# Patient Record
Sex: Female | Born: 1950 | Race: Black or African American | Hispanic: No | Marital: Single | Smoking: Former smoker
Health system: Southern US, Community
[De-identification: ages and names within clinical notes are randomized; demographics above are authoritative.]

## PROBLEM LIST (undated history)

## (undated) DIAGNOSIS — Z923 Personal history of irradiation: Secondary | ICD-10-CM

## (undated) DIAGNOSIS — C801 Malignant (primary) neoplasm, unspecified: Secondary | ICD-10-CM

## (undated) DIAGNOSIS — M5430 Sciatica, unspecified side: Secondary | ICD-10-CM

## (undated) DIAGNOSIS — I6529 Occlusion and stenosis of unspecified carotid artery: Secondary | ICD-10-CM

## (undated) DIAGNOSIS — E785 Hyperlipidemia, unspecified: Secondary | ICD-10-CM

## (undated) DIAGNOSIS — I10 Essential (primary) hypertension: Secondary | ICD-10-CM

## (undated) DIAGNOSIS — I2510 Atherosclerotic heart disease of native coronary artery without angina pectoris: Secondary | ICD-10-CM

## (undated) DIAGNOSIS — R519 Headache, unspecified: Secondary | ICD-10-CM

## (undated) HISTORY — DX: Atherosclerotic heart disease of native coronary artery without angina pectoris: I25.10

## (undated) HISTORY — DX: Sciatica, unspecified side: M54.30

## (undated) HISTORY — PX: CORONARY ARTERY BYPASS GRAFT: SHX141

## (undated) HISTORY — DX: Occlusion and stenosis of unspecified carotid artery: I65.29

## (undated) HISTORY — DX: Morbid (severe) obesity due to excess calories: E66.01

## (undated) HISTORY — DX: Hyperlipidemia, unspecified: E78.5

## (undated) HISTORY — DX: Essential (primary) hypertension: I10

---

## 2021-02-01 ENCOUNTER — Encounter: Payer: Self-pay | Admitting: Cardiology

## 2021-02-23 ENCOUNTER — Other Ambulatory Visit: Payer: Self-pay | Admitting: Family Medicine

## 2021-02-23 DIAGNOSIS — Z1231 Encounter for screening mammogram for malignant neoplasm of breast: Secondary | ICD-10-CM

## 2021-03-18 ENCOUNTER — Encounter: Payer: Self-pay | Admitting: *Deleted

## 2021-03-25 ENCOUNTER — Ambulatory Visit: Payer: Self-pay | Admitting: Cardiology

## 2021-03-28 ENCOUNTER — Other Ambulatory Visit: Payer: Self-pay

## 2021-03-28 ENCOUNTER — Ambulatory Visit: Payer: Medicare HMO | Admitting: Cardiology

## 2021-03-28 ENCOUNTER — Encounter: Payer: Self-pay | Admitting: Cardiology

## 2021-03-28 VITALS — BP 140/76 | HR 67 | Ht 65.0 in | Wt 211.0 lb

## 2021-03-28 DIAGNOSIS — R072 Precordial pain: Secondary | ICD-10-CM | POA: Diagnosis not present

## 2021-03-28 DIAGNOSIS — I10 Essential (primary) hypertension: Secondary | ICD-10-CM

## 2021-03-28 DIAGNOSIS — Z951 Presence of aortocoronary bypass graft: Secondary | ICD-10-CM

## 2021-03-28 DIAGNOSIS — E7800 Pure hypercholesterolemia, unspecified: Secondary | ICD-10-CM

## 2021-03-28 MED ORDER — ISOSORBIDE MONONITRATE ER 30 MG PO TB24: 30.0000 mg | ORAL_TABLET | Freq: Every day | ORAL | 3 refills | Status: DC

## 2021-03-28 NOTE — Patient Instructions (Signed)
Medication Instructions:   Your physician has recommended you make the following change in your medication:    START taking IMDUR (Isosorbide) 30 MG once a day.  *If you need a refill on your cardiac medications before your next appointment, please call your pharmacy*    Lab Work:  A BMP and a fasting Lipid will be drawn today.  If you have labs (blood work) drawn today and your tests are completely normal, you will receive your results only by: MyChart Message (if you have MyChart) OR A paper copy in the mail If you have any lab test that is abnormal or we need to change your treatment, we will call you to review the results.   Testing/Procedures:   Your physician has requested that you have an echocardiogram. Echocardiography is a painless test that uses sound waves to create images of your heart. It provides your doctor with information about the size and shape of your heart and how well your heart's chambers and valves are working. This procedure takes approximately one hour. There are no restrictions for this procedure.    2.  [facility] MYOVIEW   Your caregiver has ordered a Stress Test with nuclear imaging. The purpose of this test is to evaluate the blood supply to your heart muscle. This procedure is referred to as a "Non-Invasive Stress Test." This is because other than having an IV started in your vein, nothing is inserted or "invades" your body. Cardiac stress tests are done to find areas of poor blood flow to the heart by determining the extent of coronary artery disease (CAD). Some patients exercise on a treadmill, which naturally increases the blood flow to your heart, while others who are  unable to walk on a treadmill due to physical limitations have a pharmacologic/chemical stress agent called Lexiscan . This medicine will mimic walking on a treadmill by temporarily increasing your coronary blood flow.      PLEASE REPORT TO [facility] MEDICAL MALL ENTRANCE   THE VOLUNTEERS AT THE  FIRST DESK WILL DIRECT YOU WHERE TO GO     *Please note: these test may take anywhere between 2-4 hours to complete       Date of Procedure:_____________________________________   Arrival Time for Procedure:______________________________    PLEASE NOTIFY THE OFFICE AT LEAST 24 HOURS IN ADVANCE IF YOU ARE UNABLE TO KEEP YOUR APPOINTMENT.  [phone]  PLEASE NOTIFY NUCLEAR MEDICINE AT [facility] AT LEAST 24 HOURS IN ADVANCE IF YOU ARE UNABLE TO KEEP YOUR APPOINTMENT. [phone]       How to prepare for your Myoview test:    __XX__:  Hold betablocker(s) night before procedure and morning of procedure:   metoprolol tartrate (LOPRESSOR)    1. Do not eat or drink after midnight  2. No caffeine for 24 hours prior to test  3. No smoking 24 hours prior to test.  4. Unless instructed otherwise, Take your medication with a small sips of water.    5.         Ladies, please do not wear dresses. Skirts or pants are appropriate. Please wear a short sleeve shirt.  6. No perfume, cologne or lotion.  7. Wear comfortable walking shoes. No heels!     Follow-Up: At [facility], you and your health needs are our priority.  As part of our continuing mission to provide you with exceptional heart care, we have created designated Provider Care Teams.  These Care Teams include your primary Cardiologist (physician) and Advanced Practice Providers (  APPs -  Physician Assistants and Nurse Practitioners) who all work together to provide you with the care you need, when you need it.  We recommend signing up for the patient portal called "MyChart".  Sign up information is provided on this After Visit Summary.  MyChart is used to connect with patients for Virtual Visits (Telemedicine).  Patients are able to view lab/test results, encounter notes, upcoming appointments, etc.  Non-urgent messages can be sent to your provider as well.   To learn more about what you can do with MyChart, go to  [email or web address].    Your next appointment:   Follow up after testing   The format for your next appointment:   In Person  Provider:   Debbe Odea, MD  ONLY   Other Instructions

## 2021-03-28 NOTE — Progress Notes (Signed)
Cardiology Office Note:    Date:  03/28/2021   ID:  Tara Adams, DOB 04/16/1951, MRN [id number]  PCP:  [name], MD   [facility] Providers Cardiologist:  None     Referring MD: [name], MD   Chief Complaint  Patient presents with   New Patient (Initial Visit)    Referred by PCP for Intermittent chest pain. Patient states she has had CABG. Meds reviewed verbally with patient.     History of Present Illness:    Tara Adams is a 70 y.o. female with a hx of CAD/CABG x 2 (2002 ), hypertension, hyperlipidemia who presents due to chest pain.  Patient has a history of CAD/CABG about 20 years ago at [facility].  At that time, she had 4 vessels occluded, bypass was done to two of the vessels.  She was not given a reason why only 2 vessels were bypassed.  She has been doing well over the years, started having occasional chest pain 5 years ago associated with stress such as worrying about her family.  She typically takes nitroglycerin with relief of symptoms.  Also endorsed having shortness of breath with exertion.  Checks her blood pressure at home with systolic usually 427C.  Patient states she would not like to undergo another CABG procedure if possible.  Past Medical History:  Diagnosis Date   Coronary artery disease    Hyperlipidemia    Hypertension     Past Surgical History:  Procedure Laterality Date   CORONARY ARTERY BYPASS GRAFT N/A     Current Medications: Current Meds  Medication Sig   aspirin 325 MG tablet Take 325 mg by mouth daily.   atorvastatin (LIPITOR) 20 MG tablet Take 20 mg by mouth daily.   butalbital-acetaminophen-caffeine (FIORICET) 50-325-40 MG tablet Take 1-2 tablets by mouth every 4 (four) hours as needed for headache.   gabapentin (NEURONTIN) 100 MG capsule Take 100 mg by mouth 3 (three) times daily as needed.   hydrALAZINE (APRESOLINE) 50 MG tablet Take 50 mg by mouth 2 times daily at 12 noon and 4 pm.   ibuprofen (ADVIL)  800 MG tablet Take 800 mg by mouth every 8 (eight) hours as needed.   isosorbide mononitrate (IMDUR) 30 MG 24 hr tablet Take 1 tablet (30 mg total) by mouth daily.   meloxicam (MOBIC) 15 MG tablet Take 15 mg by mouth daily as needed.   metoprolol tartrate (LOPRESSOR) 100 MG tablet Take 100 mg by mouth 2 (two) times daily.   nitroGLYCERIN (NITROSTAT) 0.4 MG SL tablet Place 0.4 mg under the tongue every 5 (five) minutes as needed for chest pain.   tiZANidine (ZANAFLEX) 4 MG tablet Take 0.5-1 mg by mouth as needed for muscle spasms.   triamterene-hydrochlorothiazide (MAXZIDE) 75-50 MG tablet Take 1 tablet by mouth daily.     Allergies:   Codeine and Statins   Social History   Socioeconomic History   Marital status: Single    Spouse name: Not on file   Number of children: Not on file   Years of education: Not on file   Highest education level: Not on file  Occupational History   Not on file  Tobacco Use   Smoking status: Former    Pack years: 0.00    Types: Cigarettes    Quit date: 05/30/2019    Years since quitting: 1.8   Smokeless tobacco: Never  Substance and Sexual Activity   Alcohol use: Never    Comment: maybe 1-2  times a year   Drug use: Never   Sexual activity: Not on file  Other Topics Concern   Not on file  Social History Narrative   Not on file   Social Determinants of Health   Financial Resource Strain: Not on file  Food Insecurity: Not on file  Transportation Needs: Not on file  Physical Activity: Not on file  Stress: Not on file  Social Connections: Not on file     Family History: The patient's family history is not on file.  ROS:   Please see the history of present illness.     All other systems reviewed and are negative.  EKGs/Labs/Other Studies Reviewed:    The following studies were reviewed today:   EKG:  EKG is  ordered today.  The ekg ordered today demonstrates normal sinus rhythm, nonspecific T wave changes  Recent Labs: No results found  for requested labs within last 8760 hours.  Recent Lipid Panel No results found for: CHOL, TRIG, HDL, CHOLHDL, VLDL, LDLCALC, LDLDIRECT   Risk Assessment/Calculations:          Physical Exam:    VS:  BP 140/76 (BP Location: Left Arm, Patient Position: Sitting, Cuff Size: Normal)   Pulse 67   Ht 5\' 5"  (1.651 m)   Wt 211 lb (95.7 kg)   SpO2 93%   BMI 35.11 kg/m     Wt Readings from Last 3 Encounters:  03/28/21 211 lb (95.7 kg)     GEN:  Well nourished, well developed in no acute distress HEENT: Normal NECK: No JVD; No carotid bruits LYMPHATICS: No lymphadenopathy CARDIAC: RRR, 1/6 systolic murmurs, rubs, gallops RESPIRATORY:  Clear to auscultation without rales, wheezing or rhonchi  ABDOMEN: Soft, non-tender, non-distended MUSCULOSKELETAL:  No edema; No deformity  SKIN: Warm and dry NEUROLOGIC:  Alert and oriented x 3 PSYCHIATRIC:  Normal affect   ASSESSMENT:    1. Precordial pain   2. Hx of CABG   3. Primary hypertension   4. Pure hypercholesterolemia    PLAN:    In order of problems listed above:  Chest pain, risk factors CAD, hypertension, hyperlipidemia.  Get echo to evaluate systolic and diastolic function, get Lexiscan Myoview to evaluate presence of ischemia.  Start Imdur 30 mg daily for antianginal benefit.  Continue aspirin, Lipitor.  Consider decreasing dose of aspirin at follow-up visit to 81 mg. History of CAD/CABG x2.  Chest pain consistent with angina.  Echo and Lexiscan as above.  Aspirin, Lipitor, Imdur, beta-blocker.  Would not like to undergo another CABG procedure if possible. Hypertension, BP elevated.  Continue Lopressor, HCTZ, start Imdur. Hyperlipidemia, obtain fasting lipid profile.  Continue Lipitor.  Titrate statin as needed based on lipid panel results.  Follow-up after echo and Myoview.   Shared Decision Making/Informed Consent The risks [chest pain, shortness of breath, cardiac arrhythmias, dizziness, blood pressure fluctuations,  myocardial infarction, stroke/transient ischemic attack, nausea, vomiting, allergic reaction, radiation exposure, metallic taste sensation and life-threatening complications (estimated to be 1 in 10,000)], benefits (risk stratification, diagnosing coronary artery disease, treatment guidance) and alternatives of a nuclear stress test were discussed in detail with Tara Adams and she agrees to proceed.    Medication Adjustments/Labs and Tests Ordered: Current medicines are reviewed at length with the patient today.  Concerns regarding medicines are outlined above.  Orders Placed This Encounter  Procedures   NM Myocar Multi W/Spect W/Wall Motion / EF   Lipid panel   Basic metabolic panel   EKG 12-Lead  ECHOCARDIOGRAM COMPLETE    Meds ordered this encounter  Medications   isosorbide mononitrate (IMDUR) 30 MG 24 hr tablet    Sig: Take 1 tablet (30 mg total) by mouth daily.    Dispense:  30 tablet    Refill:  3     Patient Instructions  Medication Instructions:   Your physician has recommended you make the following change in your medication:    START taking IMDUR (Isosorbide) 30 MG once a day.  *If you need a refill on your cardiac medications before your next appointment, please call your pharmacy*    Lab Work:  A BMP and a fasting Lipid will be drawn today.  If you have labs (blood work) drawn today and your tests are completely normal, you will receive your results only by: MyChart Message (if you have MyChart) OR A paper copy in the mail If you have any lab test that is abnormal or we need to change your treatment, we will call you to review the results.   Testing/Procedures:   Your physician has requested that you have an echocardiogram. Echocardiography is a painless test that uses sound waves to create images of your heart. It provides your doctor with information about the size and shape of your heart and how well your heart's chambers and valves are working. This  procedure takes approximately one hour. There are no restrictions for this procedure.    2.  [facility] MYOVIEW   Your caregiver has ordered a Stress Test with nuclear imaging. The purpose of this test is to evaluate the blood supply to your heart muscle. This procedure is referred to as a "Non-Invasive Stress Test." This is because other than having an IV started in your vein, nothing is inserted or "invades" your body. Cardiac stress tests are done to find areas of poor blood flow to the heart by determining the extent of coronary artery disease (CAD). Some patients exercise on a treadmill, which naturally increases the blood flow to your heart, while others who are  unable to walk on a treadmill due to physical limitations have a pharmacologic/chemical stress agent called Lexiscan . This medicine will mimic walking on a treadmill by temporarily increasing your coronary blood flow.      PLEASE REPORT TO [facility] MEDICAL MALL ENTRANCE   THE VOLUNTEERS AT THE FIRST DESK WILL DIRECT YOU WHERE TO GO     *Please note: these test may take anywhere between 2-4 hours to complete       Date of Procedure:_____________________________________   Arrival Time for Procedure:______________________________    PLEASE NOTIFY THE OFFICE AT LEAST 24 HOURS IN ADVANCE IF YOU ARE UNABLE TO KEEP YOUR APPOINTMENT.  [phone]  PLEASE NOTIFY NUCLEAR MEDICINE AT [facility] AT LEAST 24 HOURS IN ADVANCE IF YOU ARE UNABLE TO KEEP YOUR APPOINTMENT. [phone]       How to prepare for your Myoview test:    __XX__:  Hold betablocker(s) night before procedure and morning of procedure:   metoprolol tartrate (LOPRESSOR)    1. Do not eat or drink after midnight  2. No caffeine for 24 hours prior to test  3. No smoking 24 hours prior to test.  4. Unless instructed otherwise, Take your medication with a small sips of water.    5.         Ladies, please do not wear dresses. Skirts or pants are appropriate. Please wear a short  sleeve shirt.  6. No perfume, cologne or lotion.  7. Wear  comfortable walking shoes. No heels!     Follow-Up: At [facility], you and your health needs are our priority.  As part of our continuing mission to provide you with exceptional heart care, we have created designated Provider Care Teams.  These Care Teams include your primary Cardiologist (physician) and Advanced Practice Providers (APPs -  Physician Assistants and Nurse Practitioners) who all work together to provide you with the care you need, when you need it.  We recommend signing up for the patient portal called "MyChart".  Sign up information is provided on this After Visit Summary.  MyChart is used to connect with patients for Virtual Visits (Telemedicine).  Patients are able to view lab/test results, encounter notes, upcoming appointments, etc.  Non-urgent messages can be sent to your provider as well.   To learn more about what you can do with MyChart, go to [email or web address].    Your next appointment:   Follow up after testing   The format for your next appointment:   In Person  Provider:   Debbe OdeaBrian Agbor-Etang, MD  ONLY   Other Instructions     Signed, Debbe OdeaBrian Agbor-Etang, MD  03/28/2021 5:09 PM    [facility]

## 2021-03-29 LAB — BASIC METABOLIC PANEL
BUN/Creatinine Ratio: 19 (ref 12–28)
BUN: 20 mg/dL (ref 8–27)
CO2: 22 mmol/L (ref 20–29)
Calcium: 9.5 mg/dL (ref 8.7–10.3)
Chloride: 102 mmol/L (ref 96–106)
Creatinine, Ser: 1.06 mg/dL — ABNORMAL HIGH (ref 0.57–1.00)
Glucose: 70 mg/dL (ref 65–99)
Potassium: 3.8 mmol/L (ref 3.5–5.2)
Sodium: 143 mmol/L (ref 134–144)
eGFR: 56 mL/min/{1.73_m2} — ABNORMAL LOW (ref >59)

## 2021-03-29 LAB — LIPID PANEL
Chol/HDL Ratio: 3.8 ratio (ref 0.0–4.4)
Cholesterol, Total: 219 mg/dL — ABNORMAL HIGH (ref 100–199)
HDL: 58 mg/dL (ref >39)
LDL Chol Calc (NIH): 130 mg/dL — ABNORMAL HIGH (ref 0–99)
Triglycerides: 177 mg/dL — ABNORMAL HIGH (ref 0–149)
VLDL Cholesterol Cal: 31 mg/dL (ref 5–40)

## 2021-03-30 ENCOUNTER — Telehealth: Payer: Self-pay | Admitting: Cardiology

## 2021-03-30 NOTE — Telephone Encounter (Signed)
-----   Message from Loman Chroman sent at 03/29/2021  8:01 AM EDT ----- Regarding: schedule f/u Patient needs to schedule f/u post testing. LVM

## 2021-03-30 NOTE — Telephone Encounter (Signed)
Attempted to schedule.  LMOV to call office.  ° °

## 2021-03-31 ENCOUNTER — Telehealth: Payer: Self-pay

## 2021-03-31 MED ORDER — ATORVASTATIN CALCIUM 80 MG PO TABS: 80.0000 mg | ORAL_TABLET | Freq: Every day | ORAL | 3 refills | Status: DC

## 2021-03-31 NOTE — Telephone Encounter (Signed)
-----   Message from Debbe Odea, MD sent at 03/31/2021  9:54 AM EDT ----- Cholesterol not controlled, increase Lipitor to 80 mg daily.  Okay to decrease aspirin dose to 81 mg daily.  Thank you

## 2021-03-31 NOTE — Telephone Encounter (Signed)
The patient has been notified of the result and verbalized understanding.  All questions (if any) were answered. Gibson Ramp, RN 03/31/2021 4:46 PM

## 2021-03-31 NOTE — Telephone Encounter (Signed)
Called patient to give her lab result note. Left a VM for a call back.

## 2021-04-08 ENCOUNTER — Other Ambulatory Visit: Payer: Medicare HMO

## 2021-04-11 ENCOUNTER — Encounter
Admission: RE | Admit: 2021-04-11 | Discharge: 2021-04-11 | Disposition: A | Discharge status: Home / Self Care | Payer: Medicare HMO | Source: Ambulatory Visit | Attending: Cardiology | Admitting: Cardiology

## 2021-04-11 ENCOUNTER — Other Ambulatory Visit: Payer: Self-pay

## 2021-04-11 DIAGNOSIS — R072 Precordial pain: Secondary | ICD-10-CM | POA: Insufficient documentation

## 2021-04-11 MED ORDER — TECHNETIUM TC 99M TETROFOSMIN IV KIT
10.0000 | PACK | Freq: Once | INTRAVENOUS | Status: AC | PRN
  Administered 2021-04-11: 10.03 via INTRAVENOUS

## 2021-04-11 MED ORDER — TECHNETIUM TC 99M TETROFOSMIN IV KIT
30.6200 | PACK | Freq: Once | INTRAVENOUS | Status: AC | PRN
  Administered 2021-04-11: 30.62 via INTRAVENOUS

## 2021-04-11 MED ORDER — REGADENOSON 0.4 MG/5ML IV SOLN
0.4000 mg | Freq: Once | INTRAVENOUS | Status: AC
  Administered 2021-04-11: 0.4 mg via INTRAVENOUS

## 2021-04-12 ENCOUNTER — Telehealth: Payer: Self-pay | Admitting: *Deleted

## 2021-04-12 LAB — NM MYOCAR MULTI W/SPECT W/WALL MOTION / EF
LV dias vol: 145 mL (ref 46–106)
LV sys vol: 57 mL
Peak HR: 95 {beats}/min
Percent HR: 64 %
Rest HR: 60 {beats}/min
TID: 1.24

## 2021-04-12 NOTE — Telephone Encounter (Signed)
Called and spoke with patient in regards to her stress test results. Advised we would like to see her in the office to review those results and determine need for further testing. She was agreeable to come in on Friday to see APP. Confirmed with provider to add on hospital day. Scheduling notified to place on that day and the time. Patient aware of appointment with no further questions at this time.

## 2021-04-12 NOTE — Telephone Encounter (Signed)
-----   Message from Yvonne Kendall, MD sent at 04/12/2021  6:40 AM EDT ----- Regarding: Abnormal stress test Good morning,  I wanted to alert you that Ms. Collazos's stress test yesterday is abnormal, high risk.  I know Dr. Azucena Cecil is out of the office this week, so I wanted to make sure that the result is conveyed to the patient.  Would it be possible to have her see an APP this week to reassess her symptoms and discuss proceeding with cath?  Thanks.  Thayer Ohm

## 2021-04-15 ENCOUNTER — Encounter: Payer: Self-pay | Admitting: Nurse Practitioner

## 2021-04-15 ENCOUNTER — Other Ambulatory Visit: Payer: Self-pay

## 2021-04-15 ENCOUNTER — Ambulatory Visit: Payer: Medicare HMO | Admitting: Nurse Practitioner

## 2021-04-15 VITALS — BP 140/74 | HR 75 | Ht 65.0 in | Wt 214.0 lb

## 2021-04-15 DIAGNOSIS — I639 Cerebral infarction, unspecified: Secondary | ICD-10-CM

## 2021-04-15 DIAGNOSIS — I200 Unstable angina: Secondary | ICD-10-CM | POA: Diagnosis not present

## 2021-04-15 DIAGNOSIS — R072 Precordial pain: Secondary | ICD-10-CM

## 2021-04-15 DIAGNOSIS — G4730 Sleep apnea, unspecified: Secondary | ICD-10-CM

## 2021-04-15 DIAGNOSIS — E785 Hyperlipidemia, unspecified: Secondary | ICD-10-CM

## 2021-04-15 DIAGNOSIS — I519 Heart disease, unspecified: Secondary | ICD-10-CM

## 2021-04-15 DIAGNOSIS — I25110 Atherosclerotic heart disease of native coronary artery with unstable angina pectoris: Secondary | ICD-10-CM | POA: Diagnosis not present

## 2021-04-15 DIAGNOSIS — I255 Ischemic cardiomyopathy: Secondary | ICD-10-CM

## 2021-04-15 DIAGNOSIS — I10 Essential (primary) hypertension: Secondary | ICD-10-CM

## 2021-04-15 DIAGNOSIS — R531 Weakness: Secondary | ICD-10-CM

## 2021-04-15 MED ORDER — HYDRALAZINE HCL 50 MG PO TABS: 50.0000 mg | ORAL_TABLET | Freq: Three times a day (TID) | ORAL | 0 refills | Status: DC

## 2021-04-15 NOTE — Patient Instructions (Addendum)
Medication Instructions:  Your physician has recommended you make the following change in your medication:   INCREASE Hydralazine 50 mg three times a day  *If you need a refill on your cardiac medications before your next appointment, please call your pharmacy*   Lab Work: CBC & BMET to be done today  If you have labs (blood work) drawn today and your tests are completely normal, you will receive your results only by: MyChart Message (if you have MyChart) OR A paper copy in the mail If you have any lab test that is abnormal or we need to change your treatment, we will call you to review the results.   Testing/Procedures:  Your physician has requested that you have a carotid duplex.   This test is an ultrasound of the carotid arteries in your neck. It looks at blood flow through these arteries that supply the brain with blood. Allow one hour for this exam. There are no restrictions or special instructions.  Your physician has requested that you have an echocardiogram.   Echocardiography is a painless test that uses sound waves to create images of your heart. It provides your doctor with information about the size and shape of your heart and how well your heart's chambers and valves are working. This procedure takes approximately one hour. There are no restrictions for this procedure.  [facility] Cardiac Cath Instructions  You are scheduled for a Cardiac Cath on: Friday 04/22/21 Please arrive at 09:00 am on the day of your procedure Please expect a call from our [facility] to pre-register you Do not eat/drink anything after midnight Someone will need to drive you home It is recommended someone be with you for the first 24 hours after your procedure Wear clothes that are easy to get on/off and wear slip on shoes if possible   Medications bring a current list of all medications with you  _XX__ You may take all of your medications the morning of your procedure with  enough water to swallow safely    Day of your procedure: Arrive at the Medical Mall entrance.  Free valet service is available.  After entering the Medical Mall please check-in at the registration desk (1st desk on your right) to receive your armband. After receiving your armband someone will escort you to the cardiac cath/special procedures waiting area.  The usual length of stay after your procedure is about 2 to 3 hours.  This can vary.  If you have any questions, please call our office at [phone], or you may call the cardiac cath lab at [facility] directly at [phone]    Follow-Up: At [facility], you and your health needs are our priority.  As part of our continuing mission to provide you with exceptional heart care, we have created designated Provider Care Teams.  These Care Teams include your primary Cardiologist (physician) and Advanced Practice Providers (APPs -  Physician Assistants and Nurse Practitioners) who all work together to provide you with the care you need, when you need it.  We recommend signing up for the patient portal called "MyChart".  Sign up information is provided on this After Visit Summary.  MyChart is used to connect with patients for Virtual Visits (Telemedicine).  Patients are able to view lab/test results, encounter notes, upcoming appointments, etc.  Non-urgent messages can be sent to your provider as well.   To learn more about what you can do with MyChart, go to [email or web address].    Your next appointment:  Friday May 06, 2021 at 2:30 pm with Nicolasa Ducking NP (Per CB)  The format for your next appointment:   In Person  Provider:   Nicolasa Ducking, NP

## 2021-04-15 NOTE — Progress Notes (Signed)
Cardiology Clinic Note   Patient Name: Tara Adams Date of Encounter: 04/15/2021  Primary Care Provider:  [name], MD Primary Cardiologist:  Debbe Odea, MD  Patient Profile    70 year old female with a history of CAD status post two-vessel bypass, hypertension, and hyperlipidemia, presents for follow-up secondary to chest pain and abnormal stress test.  Past Medical History    Past Medical History:  Diagnosis Date   Coronary artery disease    a. ~ 2002 s/p CABG x 2 ([facility]); b. 03/2021 MV: Inferior, inferolateral, mid and apical anterior ischemia. EF 41%. High risk study.   Hyperlipidemia    Hypertension    Morbid obesity (HCC)    Past Surgical History:  Procedure Laterality Date   CORONARY ARTERY BYPASS GRAFT N/A     Allergies  Allergies  Allergen Reactions   Codeine     History of Present Illness    70 year old female with the above past medical history including coronary artery disease, hypertension, and hyperlipidemia.  She underwent two-vessel bypass approximately 20 years ago at [facility]. She was lost to cardiology f/u at some point after surgery.  She had been doing well and remaining relatively active but over the past year, she has noted progressive exertional dyspnea along with progressive exertional chest pain, now occurring several times/wk, generally at higher levels of activity, lasting about 5 minutes, resolving with rest.  She will sometimes take a several nitroglycerin tablet if symptoms last longer than a few minutes and or moved to her left shoulder.  Because of progressive symptoms, her primary care provider referred her to see Dr. Azucena Cecil on July 11.  A Lexiscan Myoview was ordered and recently performed showing severe inferior and inferolateral ischemia as well as moderate mid and apical anterior ischemia.  EF was 41%.  Given the high risk nature of the study, the patient was contacted with recommendation for  follow-up diagnostic catheterization.  She notes that since her last visit, she has continued to have exertional dyspnea and chest pain.  She did try the isosorbide that was previously prescribed but she did not tolerate because it "made her feel funny."  She denies palpitations, PND, orthopnea, dizziness, syncope, edema, or early satiety.  She notes that several months ago, she had an episode where she suddenly felt lightheaded and this was associated with right-sided weakness which lasted a few minutes and resolve spontaneously.  She has never had recurrence of this weakness.    Home Medications    Current Outpatient Medications  Medication Sig Dispense Refill   aspirin 325 MG tablet Take 325 mg by mouth daily.     atorvastatin (LIPITOR) 80 MG tablet Take 1 tablet (80 mg total) by mouth daily. 90 tablet 3   butalbital-acetaminophen-caffeine (FIORICET) 50-325-40 MG tablet Take 1-2 tablets by mouth every 4 (four) hours as needed for headache.     gabapentin (NEURONTIN) 100 MG capsule Take 100 mg by mouth 3 (three) times daily as needed.     gentamicin (GARAMYCIN) 0.3 % ophthalmic solution 1 drop 4 (four) times daily.     hydrALAZINE (APRESOLINE) 50 MG tablet Take 50 mg by mouth 2 times daily at 12 noon and 4 pm.     ibuprofen (ADVIL) 800 MG tablet Take 800 mg by mouth every 8 (eight) hours as needed.     meloxicam (MOBIC) 15 MG tablet Take 15 mg by mouth daily as needed.     metoprolol tartrate (LOPRESSOR) 100 MG tablet Take  100 mg by mouth 2 (two) times daily.     nitroGLYCERIN (NITROSTAT) 0.4 MG SL tablet Place 0.4 mg under the tongue every 5 (five) minutes as needed for chest pain.     tiZANidine (ZANAFLEX) 4 MG tablet Take 0.5-1 mg by mouth as needed for muscle spasms.     triamterene-hydrochlorothiazide (MAXZIDE) 75-50 MG tablet Take 1 tablet by mouth daily.     isosorbide mononitrate (IMDUR) 30 MG 24 hr tablet Take 1 tablet (30 mg total) by mouth daily. (Patient not taking: Reported on  04/15/2021) 30 tablet 3   No current facility-administered medications for this visit.     Family History    History reviewed. No pertinent family history. She indicated that her mother is alive. She indicated that her father is deceased.  Social History    Social History   Socioeconomic History   Marital status: Single    Spouse name: Not on file   Number of children: Not on file   Years of education: Not on file   Highest education level: Not on file  Occupational History   Not on file  Tobacco Use   Smoking status: Former    Types: Cigarettes    Quit date: 05/30/2019    Years since quitting: 1.8   Smokeless tobacco: Never  Substance and Sexual Activity   Alcohol use: Never   Drug use: Never   Sexual activity: Not on file  Other Topics Concern   Not on file  Social History Narrative   Lives in [place].  Volunteers, driving elderly individuals.  Does not routinely exercise.   Social Determinants of Health   Financial Resource Strain: Not on file  Food Insecurity: Not on file  Transportation Needs: Not on file  Physical Activity: Not on file  Stress: Not on file  Social Connections: Not on file  Intimate Partner Violence: Not on file     Review of Systems    General:  No chills, fever, night sweats or weight changes.  Cardiovascular:  +++ Exertional chest pain, +++ dyspnea on exertion, no edema, orthopnea, palpitations, paroxysmal nocturnal dyspnea. Dermatological: No rash, lesions/masses Respiratory: No cough, +++ dyspnea Urologic: No hematuria, dysuria Abdominal:   No nausea, vomiting, diarrhea, bright red blood per rectum, melena, or hematemesis Neurologic:  No visual changes, +++ episode of right-sided weakness and lightheadedness several months ago.  No changes in mental status. All other systems reviewed and are otherwise negative except as noted above.  Physical Exam    VS:  BP 140/74 (BP Location: Left Arm, Patient Position: Sitting, Cuff Size:  Large)   Pulse 75   Ht 5\' 5"  (1.651 m)   Wt 214 lb (97.1 kg)   SpO2 98%   BMI 35.61 kg/m  , BMI Body mass index is 35.61 kg/m. STOP-Bang Score:  7      GEN: Obese, in no acute distress. HEENT: normal. Neck: Supple, no JVD, carotid bruits, or masses. Cardiac: RRR, 1/6 syst murmur @ the RUSB.  No rubs, or gallops. No clubbing, cyanosis, edema.  Radials/PT 2+ and equal bilaterally.  Respiratory:  Respirations regular and unlabored, clear to auscultation bilaterally. GI: Obese, soft, nontender, nondistended, BS + x 4. MS: no deformity or atrophy. Skin: warm and dry, no rash. Neuro:  Strength and sensation are intact. Psych: Normal affect.  Accessory Clinical Findings    ECG performed July 11-personally reviewed by me today-regular sinus rhythm, 67, incomplete right bundle branch block, inferolateral ST and T abnormalities  Lab Results  Component Value Date   CREATININE 1.06 (H) 03/28/2021   BUN 20 03/28/2021   NA 143 03/28/2021   K 3.8 03/28/2021   CL 102 03/28/2021   CO2 22 03/28/2021   Lab Results  Component Value Date   CHOL 219 (H) 03/28/2021   HDL 58 03/28/2021   LDLCALC 130 (H) 03/28/2021   TRIG 177 (H) 03/28/2021   CHOLHDL 3.8 03/28/2021    Assessment & Plan   1.  Unstable angina/coronary artery disease: Patient with a history of CABG ~ 20 yrs ago in [place].  She believes that she had a 2 vessel bypass, which included a LIMA.  Over the past year, she has had progressive DOE and exertional chest pain, which has been occurring a few times/wk, lasting ~ 5 mins, and resolving with rest or sl NTG.  In the setting of progressive symptoms, she recently underwent stress testing, revealing severe inferior inferior and inferolateral ischemia as well as moderate mid and apical anterior ischemia.  EF was 41%. She was tried on imdur, but this caused her to feel funny, and so she stopped taking.  We discussed options for mgmt of ongoing Ss and abnl stress test, and will plan on a  diagnostic catheterization next week.  The patient understands that risks include but are not limited to stroke (1 in 1000), death (1 in 1000), kidney failure [usually temporary] (1 in 500), bleeding (1 in 200), allergic reaction [possibly serious] (1 in 200), and agrees to proceed. Cont asa, statin, and ? blocker therapy.    2.  Essential HTN:  poorly controlled.  She says that she is accustomed to seeing BPs in the 140's.  I have asked her to begin taking her hydralazine 3 times a day.  She otherwise remains on diuretic and beta-blocker therapy.  3.  Hyperlipidemia: LDL was 130 on July 11 and her Lipitor dose was increased to 80 mg.  She is tolerating this thus far.  She will require follow-up lipids and LFTs in about 6 weeks.  4.  Right-sided weakness: Patient reports today that several months ago possibly longer, she had an episode of headache with lightheadedness and right-sided weakness that lasted few minutes and resolve spontaneously.  She has had no recurrence.  Her neuro exam is normal.  I do not appreciate any carotid bruits we will arrange for a carotid ultrasound to rule out.  We will also arrange for 2D echocardiogram, especially in light of mild LV dysfunction noted on stress testing.  5.  Ischemic cardiomyopathy: EF measured at 41% on stress testing though visually, it was felt to be better.  We will arrange for echocardiogram.  Continue beta-blocker.  His LV function is truly depressed, would have a low threshold to add ARB or Entresto post cath.  6.  Sleep disordered breathing: Patient notes that she sleeps "funky."  She believes she snores and is not always well rested in the morning.  STOP-Bang = 7.  We discussed outpatient sleep study and she would be interested but we agreed to defer until at least after catheterization.  7.  Morbid obesity: Pending catheterization results, patient would likely benefit from cardiac rehabilitation.  8.  Disposition: Follow-up CBC and basic  metabolic panel today.  Arrange for carotid ultrasound and 2D echocardiogram.  Plan on diagnostic catheterization next week.  Follow-up in clinic 2 weeks post catheterization.+  Nicolasa Ducking, NP 04/15/2021, 3:25 PM

## 2021-04-16 LAB — BASIC METABOLIC PANEL
BUN/Creatinine Ratio: 22 (ref 12–28)
BUN: 26 mg/dL (ref 8–27)
CO2: 19 mmol/L — ABNORMAL LOW (ref 20–29)
Calcium: 9.4 mg/dL (ref 8.7–10.3)
Chloride: 105 mmol/L (ref 96–106)
Creatinine, Ser: 1.2 mg/dL — ABNORMAL HIGH (ref 0.57–1.00)
Glucose: 76 mg/dL (ref 65–99)
Potassium: 4 mmol/L (ref 3.5–5.2)
Sodium: 143 mmol/L (ref 134–144)
eGFR: 48 mL/min/{1.73_m2} — ABNORMAL LOW (ref >59)

## 2021-04-16 LAB — CBC
Hematocrit: 39.2 % (ref 34.0–46.6)
Hemoglobin: 12.4 g/dL (ref 11.1–15.9)
MCH: 23.9 pg — ABNORMAL LOW (ref 26.6–33.0)
MCHC: 31.6 g/dL (ref 31.5–35.7)
MCV: 76 fL — ABNORMAL LOW (ref 79–97)
Platelets: 247 10*3/uL (ref 150–450)
RBC: 5.18 x10E6/uL (ref 3.77–5.28)
RDW: 16.6 % — ABNORMAL HIGH (ref 11.7–15.4)
WBC: 5.4 10*3/uL (ref 3.4–10.8)

## 2021-04-18 ENCOUNTER — Telehealth: Payer: Self-pay | Admitting: *Deleted

## 2021-04-18 NOTE — Telephone Encounter (Signed)
Left voicemail message to call back for review of results.  

## 2021-04-18 NOTE — Telephone Encounter (Signed)
Reviewed results and recommendations with patient. She verbalized understanding and wanted to check with [place] about knot under her arm. Recommended that she check with her primary care provider for that and her lab results. Instructed her to hold triamterene-Hydrochlorothiazide for Wednesday, Thursday, and Friday. Also discussed importance to hydrate as well. She verbalized understanding of our conversation, read back instructions, and had no further questions at this time.

## 2021-04-18 NOTE — Telephone Encounter (Signed)
-----   Message from Creig Hines, NP sent at 04/16/2021 11:32 AM EDT ----- H/H nl.  She is mildly microcytic/hypchromic - often seen in iron deficiency - but not anemic.  This can be f/u by PCP.  Creat mildly elevated @ 1.2.  Overall, labs suitable for cath on 8/5, but she should increase hydration this week and hold her triamterene/hctz on Wed, Thurs, and Fri.

## 2021-04-19 ENCOUNTER — Telehealth: Payer: Self-pay | Admitting: Cardiology

## 2021-04-19 NOTE — Telephone Encounter (Signed)
Attempted to call pt back. No answer. Lmtcb.  

## 2021-04-19 NOTE — Telephone Encounter (Signed)
Patient returning call.

## 2021-04-19 NOTE — Telephone Encounter (Signed)
Spoke to pt. Notified if she is negative and has no covid symptoms then she may proceed with cath procedure this Friday.  Advised pt to let us know if she does develop s/s and/or re tests positive at home.  Pt voiced understanding.

## 2021-04-19 NOTE — Telephone Encounter (Signed)
Patient has cardiac cath on Friday 08/05 Patient lives with niece and daughter, they have both tested positive for COVID  Patient took a COVID test today and it came back negative Would like to know if she can still continue with cath Please call to discuss

## 2021-04-21 ENCOUNTER — Telehealth: Payer: Self-pay | Admitting: Cardiology

## 2021-04-21 NOTE — Telephone Encounter (Signed)
Patient seen in office on 04/15/21 by Ward Givens, NP and he ordered the cath at that time.   To RN for Ward Givens, NP.

## 2021-04-21 NOTE — Telephone Encounter (Signed)
Patient would like to r/s her cath for tomorrow due to COVID like symptoms. Please call.

## 2021-04-21 NOTE — Telephone Encounter (Signed)
Noted. Closing ecounter

## 2021-04-21 NOTE — Telephone Encounter (Signed)
Spoke with patient and she tested positive for COVID. She requested to cancel procedure and will call back to reschedule once she is feeling better. Several people in her household have tested positive so she just wishes to wait for now and verbalized understanding to please call us back once she is ready to reschedule. Advised I would update providers as well. No further questions for now.

## 2021-04-21 NOTE — Telephone Encounter (Signed)
See duplicate encounter started on 04/21/21. Closing encounter.

## 2021-04-21 NOTE — Telephone Encounter (Signed)
Patient calling States that she is noticing symptoms Would like to reschedule Cath

## 2021-04-21 NOTE — Telephone Encounter (Signed)
Left voicemail message that I canceled her procedure for tomorrow and to please call back so that we can reschedule.

## 2021-04-21 NOTE — Telephone Encounter (Signed)
Patient is calling back to states she DID test positive for COVID and will call back to reschedule after she knows that noone else in her household is positive.

## 2021-04-22 ENCOUNTER — Ambulatory Visit: Admission: RE | Admit: 2021-04-22 | Payer: Medicare HMO | Source: Home / Self Care | Admitting: Cardiovascular Disease

## 2021-04-22 ENCOUNTER — Encounter: Admission: RE | Payer: Self-pay | Source: Home / Self Care

## 2021-04-22 DIAGNOSIS — R9439 Abnormal result of other cardiovascular function study: Secondary | ICD-10-CM

## 2021-04-22 SURGERY — LEFT HEART CATH AND CORS/GRAFTS ANGIOGRAPHY
Anesthesia: Moderate Sedation | Laterality: Left

## 2021-04-22 NOTE — Telephone Encounter (Signed)
Spoke with patient and reviewed appointments that we currently have her scheduled. Appointment with Ward Givens NP is being cancelled since this was a 2 week post cath appointment. She still has echocardiogram and carotid ultrasound to be done on 05/12/21 starting at 2:00 pm. Advised that if she is not feeling better then to call and we can reschedule if needed. Instructed her to please call us back when she is able to reschedule her heart cath so that we can set that up and reschedule her follow up post procedure. She verbalized understanding of all instructions, agreement with plan, and had no further questions at this time.

## 2021-04-29 NOTE — Telephone Encounter (Signed)
Patient calling to reschedule cath.

## 2021-04-29 NOTE — Telephone Encounter (Signed)
Spoke with patient and we discussed rescheduling her procedure. She is requesting to schedule this for further out so that her daughter can be in town to bring her and stay with her. Reviewed schedule to make sure Dr. Kirke Corin is here that day in the cath lab and confirmed that day works for her. Advised that we would need repeat labs earlier that week as well as well. She would like me to call her back on Monday with this information and if I get her voicemail to leave a message and she will call back. She was appreciative for the call back with no further questions at this time.  Left heart cath Cors/grafts scheduled for 05/20/21 at 07:30 am with Dr. Kirke Corin.

## 2021-04-29 NOTE — Telephone Encounter (Signed)
Left voicemail message to call back in order to schedule her procedure.

## 2021-05-02 ENCOUNTER — Encounter: Payer: Self-pay | Admitting: *Deleted

## 2021-05-02 NOTE — Telephone Encounter (Signed)
Left voicemail message to call back for review of procedure instructions, date, time, and follow up information.

## 2021-05-02 NOTE — Telephone Encounter (Signed)
Left voicemail message to call back for review of procedure.

## 2021-05-02 NOTE — Telephone Encounter (Signed)
Letter created with procedure instructions and information. Mailing to patient and will continue trying to call her to review as well.

## 2021-05-04 NOTE — Telephone Encounter (Signed)
Spoke with patient and reviewed instructions for her upcoming procedure. Discussed date, time, and medications. We also discussed her follow up appointment as well. She was appreciative for the call back with no further questions at this time.

## 2021-05-06 ENCOUNTER — Ambulatory Visit: Payer: Medicare HMO | Admitting: Nurse Practitioner

## 2021-05-09 ENCOUNTER — Telehealth: Payer: Self-pay | Admitting: Nurse Practitioner

## 2021-05-09 NOTE — Telephone Encounter (Signed)
Patient calling to discuss & questions of upcoming echo appt. Please assist

## 2021-05-09 NOTE — Telephone Encounter (Signed)
Reviewed patient questions about imaging with her stress test. We discussed that imaging and what they are looking for during that test. She then inquired about the upcoming imaging that we have her scheduled to be done. Testing she inquired about were her echocardiogram and carotid ultrasound. Reviewed what these tests are for and how they are different. Confirmed times for those and she verbalized understanding of our conversation, agreement with plan, times for her testing, and had no further questions at this time.

## 2021-05-12 ENCOUNTER — Ambulatory Visit (INDEPENDENT_AMBULATORY_CARE_PROVIDER_SITE_OTHER): Payer: Medicare HMO

## 2021-05-12 ENCOUNTER — Other Ambulatory Visit: Payer: Self-pay

## 2021-05-12 ENCOUNTER — Other Ambulatory Visit: Payer: Medicare HMO

## 2021-05-12 ENCOUNTER — Other Ambulatory Visit (INDEPENDENT_AMBULATORY_CARE_PROVIDER_SITE_OTHER): Payer: Medicare HMO

## 2021-05-12 DIAGNOSIS — I6389 Other cerebral infarction: Secondary | ICD-10-CM

## 2021-05-12 DIAGNOSIS — R072 Precordial pain: Secondary | ICD-10-CM

## 2021-05-12 DIAGNOSIS — I25110 Atherosclerotic heart disease of native coronary artery with unstable angina pectoris: Secondary | ICD-10-CM

## 2021-05-12 DIAGNOSIS — I639 Cerebral infarction, unspecified: Secondary | ICD-10-CM

## 2021-05-12 LAB — ECHOCARDIOGRAM COMPLETE
AR max vel: 1.36 cm2
AV Area VTI: 1.49 cm2
AV Area mean vel: 1.36 cm2
AV Mean grad: 9 mmHg
AV Peak grad: 16.2 mmHg
Ao pk vel: 2.02 m/s
Area-P 1/2: 3.24 cm2
Calc EF: 51.2 %
S' Lateral: 4.9 cm
Single Plane A2C EF: 46.4 %
Single Plane A4C EF: 53.5 %

## 2021-05-12 NOTE — Progress Notes (Signed)
Labs for Cath Lab

## 2021-05-13 LAB — BASIC METABOLIC PANEL
BUN/Creatinine Ratio: 26 (ref 12–28)
BUN: 27 mg/dL (ref 8–27)
CO2: 23 mmol/L (ref 20–29)
Calcium: 9.2 mg/dL (ref 8.7–10.3)
Chloride: 106 mmol/L (ref 96–106)
Creatinine, Ser: 1.02 mg/dL — ABNORMAL HIGH (ref 0.57–1.00)
Glucose: 74 mg/dL (ref 65–99)
Potassium: 4.2 mmol/L (ref 3.5–5.2)
Sodium: 146 mmol/L — ABNORMAL HIGH (ref 134–144)
eGFR: 58 mL/min/{1.73_m2} — ABNORMAL LOW (ref >59)

## 2021-05-13 LAB — CBC
Hematocrit: 35.7 % (ref 34.0–46.6)
Hemoglobin: 11.7 g/dL (ref 11.1–15.9)
MCH: 24 pg — ABNORMAL LOW (ref 26.6–33.0)
MCHC: 32.8 g/dL (ref 31.5–35.7)
MCV: 73 fL — ABNORMAL LOW (ref 79–97)
Platelets: 293 10*3/uL (ref 150–450)
RBC: 4.88 x10E6/uL (ref 3.77–5.28)
RDW: 15.5 % — ABNORMAL HIGH (ref 11.7–15.4)
WBC: 7.2 10*3/uL (ref 3.4–10.8)

## 2021-05-16 ENCOUNTER — Telehealth: Payer: Self-pay

## 2021-05-16 NOTE — Telephone Encounter (Signed)
Called patient and discussed the Echo result from Dr. Azucena Cecil as well as her Carotid US result from Ward Givens as follows:  Study was performed b/c of a report of right sided weakness several months ago.  The left internal carotid artery is noted to have at least a 60-79% blockage, while the right side has less than 40% stenosis.  The doctor that read this study suggested that we obtain additional imaging of the left internal carotid stenosis, in order to determine if it is more severe than it appears on ultrasound. This would be done with a CTA of the head and neck vessels.  If a significant abnormality were noted on CT, we would refer to vascular surgery.  If she'd like to proceed w/ CTA, we can order and arrange for her, though with pending cath, it may be better to wait to discuss further at follow-up after cath.  Patient will wait until after her Cath lab procedure and discuss this with Dr. Azucena Cecil at her follow up on 05/31/21. She would like to proceed with the CTA after that.

## 2021-05-20 ENCOUNTER — Ambulatory Visit
Admission: RE | Admit: 2021-05-20 | Discharge: 2021-05-20 | Disposition: A | Discharge status: Home / Self Care | Payer: Medicare HMO | Attending: Cardiovascular Disease | Admitting: Cardiovascular Disease

## 2021-05-20 ENCOUNTER — Other Ambulatory Visit: Payer: Self-pay

## 2021-05-20 ENCOUNTER — Encounter: Payer: Self-pay | Admitting: Cardiovascular Disease

## 2021-05-20 ENCOUNTER — Encounter
Admission: RE | Disposition: A | Discharge status: Home / Self Care | Payer: Self-pay | Source: Home / Self Care | Attending: Cardiovascular Disease

## 2021-05-20 DIAGNOSIS — E785 Hyperlipidemia, unspecified: Secondary | ICD-10-CM | POA: Insufficient documentation

## 2021-05-20 DIAGNOSIS — I2582 Chronic total occlusion of coronary artery: Secondary | ICD-10-CM | POA: Insufficient documentation

## 2021-05-20 DIAGNOSIS — Z7982 Long term (current) use of aspirin: Secondary | ICD-10-CM | POA: Diagnosis not present

## 2021-05-20 DIAGNOSIS — I25118 Atherosclerotic heart disease of native coronary artery with other forms of angina pectoris: Secondary | ICD-10-CM

## 2021-05-20 DIAGNOSIS — I25708 Atherosclerosis of coronary artery bypass graft(s), unspecified, with other forms of angina pectoris: Secondary | ICD-10-CM | POA: Diagnosis not present

## 2021-05-20 DIAGNOSIS — Z79899 Other long term (current) drug therapy: Secondary | ICD-10-CM | POA: Diagnosis not present

## 2021-05-20 DIAGNOSIS — Z87891 Personal history of nicotine dependence: Secondary | ICD-10-CM | POA: Insufficient documentation

## 2021-05-20 DIAGNOSIS — Z885 Allergy status to narcotic agent status: Secondary | ICD-10-CM | POA: Insufficient documentation

## 2021-05-20 DIAGNOSIS — Z951 Presence of aortocoronary bypass graft: Secondary | ICD-10-CM | POA: Diagnosis not present

## 2021-05-20 DIAGNOSIS — I10 Essential (primary) hypertension: Secondary | ICD-10-CM | POA: Diagnosis not present

## 2021-05-20 DIAGNOSIS — R9439 Abnormal result of other cardiovascular function study: Secondary | ICD-10-CM

## 2021-05-20 HISTORY — PX: LEFT HEART CATH AND CORS/GRAFTS ANGIOGRAPHY: CATH118250

## 2021-05-20 SURGERY — LEFT HEART CATH AND CORS/GRAFTS ANGIOGRAPHY
Anesthesia: Moderate Sedation

## 2021-05-20 MED ORDER — LIDOCAINE HCL 1 % IJ SOLN
INTRAMUSCULAR | Status: AC
  Filled 2021-05-20: qty 20

## 2021-05-20 MED ORDER — LIDOCAINE HCL (PF) 1 % IJ SOLN
INTRAMUSCULAR | Status: DC | PRN
  Administered 2021-05-20: 2 mL

## 2021-05-20 MED ORDER — SODIUM CHLORIDE 0.9 % IV SOLN: INTRAVENOUS | Status: DC

## 2021-05-20 MED ORDER — SODIUM CHLORIDE 0.9 % WEIGHT BASED INFUSION: 1.0000 mL/kg/h | INTRAVENOUS | Status: DC

## 2021-05-20 MED ORDER — SODIUM CHLORIDE 0.9 % WEIGHT BASED INFUSION
3.0000 mL/kg/h | INTRAVENOUS | Status: AC
Start: 2021-05-20 — End: 2021-05-20

## 2021-05-20 MED ORDER — SODIUM CHLORIDE 0.9 % IV SOLN: 250.0000 mL | INTRAVENOUS | Status: DC | PRN

## 2021-05-20 MED ORDER — HEPARIN (PORCINE) IN NACL 2000-0.9 UNIT/L-% IV SOLN
INTRAVENOUS | Status: DC | PRN
  Administered 2021-05-20: 1000 mL

## 2021-05-20 MED ORDER — VERAPAMIL HCL 2.5 MG/ML IV SOLN
INTRAVENOUS | Status: AC
  Filled 2021-05-20: qty 2

## 2021-05-20 MED ORDER — HEPARIN SODIUM (PORCINE) 1000 UNIT/ML IJ SOLN
INTRAMUSCULAR | Status: AC
  Filled 2021-05-20: qty 1

## 2021-05-20 MED ORDER — SODIUM CHLORIDE 0.9% FLUSH: 3.0000 mL | INTRAVENOUS | Status: DC | PRN

## 2021-05-20 MED ORDER — SODIUM CHLORIDE 0.9% FLUSH
3.0000 mL | Freq: Two times a day (BID) | INTRAVENOUS | Status: DC
Start: 2021-05-20 — End: 2021-05-20

## 2021-05-20 MED ORDER — MIDAZOLAM HCL 2 MG/2ML IJ SOLN
INTRAMUSCULAR | Status: DC | PRN
  Administered 2021-05-20: 1 mg via INTRAVENOUS

## 2021-05-20 MED ORDER — ACETAMINOPHEN 325 MG PO TABS: 650.0000 mg | ORAL_TABLET | ORAL | Status: DC | PRN

## 2021-05-20 MED ORDER — ASPIRIN 81 MG PO CHEW
81.0000 mg | CHEWABLE_TABLET | ORAL | Status: DC
Start: 2021-05-21 — End: 2021-05-20

## 2021-05-20 MED ORDER — HEPARIN (PORCINE) IN NACL 1000-0.9 UT/500ML-% IV SOLN
INTRAVENOUS | Status: AC
  Filled 2021-05-20: qty 1000

## 2021-05-20 MED ORDER — VERAPAMIL HCL 2.5 MG/ML IV SOLN
INTRAVENOUS | Status: DC | PRN
  Administered 2021-05-20: 2.5 mg via INTRA_ARTERIAL

## 2021-05-20 MED ORDER — FENTANYL CITRATE PF 50 MCG/ML IJ SOSY
PREFILLED_SYRINGE | INTRAMUSCULAR | Status: AC
  Filled 2021-05-20: qty 1

## 2021-05-20 MED ORDER — MIDAZOLAM HCL 2 MG/2ML IJ SOLN
INTRAMUSCULAR | Status: AC
  Filled 2021-05-20: qty 2

## 2021-05-20 MED ORDER — ONDANSETRON HCL 4 MG/2ML IJ SOLN: 4.0000 mg | Freq: Four times a day (QID) | INTRAMUSCULAR | Status: DC | PRN

## 2021-05-20 MED ORDER — HEPARIN SODIUM (PORCINE) 1000 UNIT/ML IJ SOLN
INTRAMUSCULAR | Status: DC | PRN
  Administered 2021-05-20: 5000 [IU] via INTRAVENOUS

## 2021-05-20 MED ORDER — FENTANYL CITRATE (PF) 100 MCG/2ML IJ SOLN
INTRAMUSCULAR | Status: DC | PRN
  Administered 2021-05-20: 50 ug via INTRAVENOUS

## 2021-05-20 MED ORDER — SODIUM CHLORIDE 0.9% FLUSH: 3.0000 mL | Freq: Two times a day (BID) | INTRAVENOUS | Status: DC

## 2021-05-20 MED ORDER — IOHEXOL 350 MG/ML SOLN
INTRAVENOUS | Status: DC | PRN
  Administered 2021-05-20: 68 mL

## 2021-05-20 SURGICAL SUPPLY — 13 items
CATH INFINITI 5 FR IM (CATHETERS) ×2 IMPLANT
CATH INFINITI 5 FR JL3.5 (CATHETERS) ×2 IMPLANT
CATH INFINITI JR4 5F (CATHETERS) ×2 IMPLANT
DEVICE RAD TR BAND REGULAR (VASCULAR PRODUCTS) ×2 IMPLANT
DRAPE BRACHIAL (DRAPES) ×2 IMPLANT
GLIDESHEATH SLEND SS 6F .021 (SHEATH) ×2 IMPLANT
GUIDEWIRE INQWIRE 1.5J.035X260 (WIRE) ×1 IMPLANT
INQWIRE 1.5J .035X260CM (WIRE) ×2
PACK CARDIAC CATH (CUSTOM PROCEDURE TRAY) ×2 IMPLANT
PANNUS RETENTION SYSTEM 2 PAD (MISCELLANEOUS) ×2 IMPLANT
PROTECTION STATION PRESSURIZED (MISCELLANEOUS) ×2
SET ATX SIMPLICITY (MISCELLANEOUS) ×2 IMPLANT
STATION PROTECTION PRESSURIZED (MISCELLANEOUS) ×1 IMPLANT

## 2021-05-20 NOTE — H&P (Signed)
Cardiology Clinic Note    Patient Name: Tara Adams Date of Encounter: 04/15/2021   Primary Care Provider:  [name], MD Primary Cardiologist:  Debbe Odea, MD   Patient Profile    70 year old female with a history of CAD status post two-vessel bypass, hypertension, and hyperlipidemia, presents for follow-up secondary to chest pain and abnormal stress test.   Past Medical History        Past Medical History:  Diagnosis Date   Coronary artery disease      a. ~ 2002 s/p CABG x 2 ([facility]); b. 03/2021 MV: Inferior, inferolateral, mid and apical anterior ischemia. EF 41%. High risk study.   Hyperlipidemia     Hypertension     Morbid obesity (HCC)           Past Surgical History:  Procedure Laterality Date   CORONARY ARTERY BYPASS GRAFT N/A        Allergies       Allergies  Allergen Reactions   Codeine        History of Present Illness    70 year old female with the above past medical history including coronary artery disease, hypertension, and hyperlipidemia.  She underwent two-vessel bypass approximately 20 years ago at [facility]. She was lost to cardiology f/u at some point after surgery.  She had been doing well and remaining relatively active but over the past year, she has noted progressive exertional dyspnea along with progressive exertional chest pain, now occurring several times/wk, generally at higher levels of activity, lasting about 5 minutes, resolving with rest.  She will sometimes take a several nitroglycerin tablet if symptoms last longer than a few minutes and or moved to her left shoulder.  Because of progressive symptoms, her primary care provider referred her to see Dr. Azucena Cecil on July 11.  A Lexiscan Myoview was ordered and recently performed showing severe inferior and inferolateral ischemia as well as moderate mid and apical anterior ischemia.  EF was 41%.  Given the high risk nature of the study, the patient was  contacted with recommendation for follow-up diagnostic catheterization.  She notes that since her last visit, she has continued to have exertional dyspnea and chest pain.  She did try the isosorbide that was previously prescribed but she did not tolerate because it "made her feel funny."  She denies palpitations, PND, orthopnea, dizziness, syncope, edema, or early satiety.  She notes that several months ago, she had an episode where she suddenly felt lightheaded and this was associated with right-sided weakness which lasted a few minutes and resolve spontaneously.  She has never had recurrence of this weakness.     Home Medications          Current Outpatient Medications  Medication Sig Dispense Refill   aspirin 325 MG tablet Take 325 mg by mouth daily.       atorvastatin (LIPITOR) 80 MG tablet Take 1 tablet (80 mg total) by mouth daily. 90 tablet 3   butalbital-acetaminophen-caffeine (FIORICET) 50-325-40 MG tablet Take 1-2 tablets by mouth every 4 (four) hours as needed for headache.       gabapentin (NEURONTIN) 100 MG capsule Take 100 mg by mouth 3 (three) times daily as needed.       gentamicin (GARAMYCIN) 0.3 % ophthalmic solution 1 drop 4 (four) times daily.       hydrALAZINE (APRESOLINE) 50 MG tablet Take 50 mg by mouth 2 times daily at 12 noon and 4 pm.  ibuprofen (ADVIL) 800 MG tablet Take 800 mg by mouth every 8 (eight) hours as needed.       meloxicam (MOBIC) 15 MG tablet Take 15 mg by mouth daily as needed.       metoprolol tartrate (LOPRESSOR) 100 MG tablet Take 100 mg by mouth 2 (two) times daily.       nitroGLYCERIN (NITROSTAT) 0.4 MG SL tablet Place 0.4 mg under the tongue every 5 (five) minutes as needed for chest pain.       tiZANidine (ZANAFLEX) 4 MG tablet Take 0.5-1 mg by mouth as needed for muscle spasms.       triamterene-hydrochlorothiazide (MAXZIDE) 75-50 MG tablet Take 1 tablet by mouth daily.       isosorbide mononitrate (IMDUR) 30 MG 24 hr tablet Take 1 tablet (30  mg total) by mouth daily. (Patient not taking: Reported on 04/15/2021) 30 tablet 3    No current facility-administered medications for this visit.      Family History    History reviewed. No pertinent family history. She indicated that her mother is alive. She indicated that her father is deceased.   Social History    Social History         Socioeconomic History   Marital status: Single      Spouse name: Not on file   Number of children: Not on file   Years of education: Not on file   Highest education level: Not on file  Occupational History   Not on file  Tobacco Use   Smoking status: Former      Types: Cigarettes      Quit date: 05/30/2019      Years since quitting: 1.8   Smokeless tobacco: Never  Substance and Sexual Activity   Alcohol use: Never   Drug use: Never   Sexual activity: Not on file  Other Topics Concern   Not on file  Social History Narrative    Lives in [place].  Volunteers, driving elderly individuals.  Does not routinely exercise.    Social Determinants of Health    Financial Resource Strain: Not on file  Food Insecurity: Not on file  Transportation Needs: Not on file  Physical Activity: Not on file  Stress: Not on file  Social Connections: Not on file  Intimate Partner Violence: Not on file      Review of Systems    General:  No chills, fever, night sweats or weight changes.  Cardiovascular:  +++ Exertional chest pain, +++ dyspnea on exertion, no edema, orthopnea, palpitations, paroxysmal nocturnal dyspnea. Dermatological: No rash, lesions/masses Respiratory: No cough, +++ dyspnea Urologic: No hematuria, dysuria Abdominal:   No nausea, vomiting, diarrhea, bright red blood per rectum, melena, or hematemesis Neurologic:  No visual changes, +++ episode of right-sided weakness and lightheadedness several months ago.  No changes in mental status. All other systems reviewed and are otherwise negative except as noted above.   Physical Exam     VS:  BP 140/74 (BP Location: Left Arm, Patient Position: Sitting, Cuff Size: Large)   Pulse 75   Ht 5\' 5"  (1.651 m)   Wt 214 lb (97.1 kg)   SpO2 98%   BMI 35.61 kg/m  , BMI Body mass index is 35.61 kg/m. STOP-Bang Score:  7      GEN: Obese, in no acute distress. HEENT: normal. Neck: Supple, no JVD, carotid bruits, or masses. Cardiac: RRR, 1/6 syst murmur @ the RUSB.  No rubs, or gallops. No clubbing, cyanosis, edema.  Radials/PT 2+ and equal bilaterally.  Respiratory:  Respirations regular and unlabored, clear to auscultation bilaterally. GI: Obese, soft, nontender, nondistended, BS + x 4. MS: no deformity or atrophy. Skin: warm and dry, no rash. Neuro:  Strength and sensation are intact. Psych: Normal affect.   Accessory Clinical Findings    ECG performed July 11-personally reviewed by me today-regular sinus rhythm, 67, incomplete right bundle branch block, inferolateral ST and T abnormalities   Recent Labs       Lab Results  Component Value Date    CREATININE 1.06 (H) 03/28/2021    BUN 20 03/28/2021    NA 143 03/28/2021    K 3.8 03/28/2021    CL 102 03/28/2021    CO2 22 03/28/2021      Recent Labs       Lab Results  Component Value Date    CHOL 219 (H) 03/28/2021    HDL 58 03/28/2021    LDLCALC 130 (H) 03/28/2021    TRIG 177 (H) 03/28/2021    CHOLHDL 3.8 03/28/2021      Assessment & Plan    1.  Unstable angina/coronary artery disease: Patient with a history of CABG ~ 20 yrs ago in [place].  She believes that she had a 2 vessel bypass, which included a LIMA.  Over the past year, she has had progressive DOE and exertional chest pain, which has been occurring a few times/wk, lasting ~ 5 mins, and resolving with rest or sl NTG.  In the setting of progressive symptoms, she recently underwent stress testing, revealing severe inferior inferior and inferolateral ischemia as well as moderate mid and apical anterior ischemia.  EF was 41%. She was tried on imdur, but this  caused her to feel funny, and so she stopped taking.  We discussed options for mgmt of ongoing Ss and abnl stress test, and will plan on a diagnostic catheterization next week.  The patient understands that risks include but are not limited to stroke (1 in 1000), death (1 in 1000), kidney failure [usually temporary] (1 in 500), bleeding (1 in 200), allergic reaction [possibly serious] (1 in 200), and agrees to proceed. Cont asa, statin, and ? blocker therapy.     2.  Essential HTN:  poorly controlled.  She says that she is accustomed to seeing BPs in the 140's.  I have asked her to begin taking her hydralazine 3 times a day.  She otherwise remains on diuretic and beta-blocker therapy.   3.  Hyperlipidemia: LDL was 130 on July 11 and her Lipitor dose was increased to 80 mg.  She is tolerating this thus far.  She will require follow-up lipids and LFTs in about 6 weeks.  4.  Right-sided weakness: Patient reports today that several months ago possibly longer, she had an episode of headache with lightheadedness and right-sided weakness that lasted few minutes and resolve spontaneously.  She has had no recurrence.  Her neuro exam is normal.  I do not appreciate any carotid bruits we will arrange for a carotid ultrasound to rule out.  We will also arrange for 2D echocardiogram, especially in light of mild LV dysfunction noted on stress testing.  5.  Ischemic cardiomyopathy: EF measured at 41% on stress testing though visually, it was felt to be better.  We will arrange for echocardiogram.  Continue beta-blocker.  His LV function is truly depressed, would have a low threshold to add ARB or Entresto post cath.  6.  Sleep disordered breathing: Patient notes  that she sleeps "funky."  She believes she snores and is not always well rested in the morning.  STOP-Bang = 7.  We discussed outpatient sleep study and she would be interested but we agreed to defer until at least after catheterization.  7.  Morbid obesity:  Pending catheterization results, patient would likely benefit from cardiac rehabilitation.   8.  Disposition: Follow-up CBC and basic metabolic panel today.  Arrange for carotid ultrasound and 2D echocardiogram.  Plan on diagnostic catheterization next week.  Follow-up in clinic 2 weeks post catheterization.+   Nicolasa Duckinghristopher Berge, NP 04/15/2021, 3:25 PM      Addendum on 05/20/2021 The patient was seen and examined.  She complains of exertional chest pain.  Previous remote CABG.  Nuclear stress test showed large inferior and inferolateral infarct with ischemia.  High risk study.  By physical exam, heart is regular with 1 out of 6 systolic murmur in the aortic area.  Right and left radial pulses are normal.  I discussed the cardiac catheterization procedure with the patient again as well as risks and benefits and she is agreeable to proceed.  Planned access is via the left radial artery.

## 2021-05-25 ENCOUNTER — Telehealth: Payer: Self-pay | Admitting: Cardiovascular Disease

## 2021-05-25 NOTE — Telephone Encounter (Signed)
Called patient and informed her that her letter is ready for pick up. She confirmed that she will be here to pick it up this afternoon.

## 2021-05-25 NOTE — Telephone Encounter (Signed)
Patient requesting a work note from surgery 05/20/21 for 9/5, 9/6 & 9/7 going back to work tomorrow.

## 2021-05-31 ENCOUNTER — Encounter: Payer: Self-pay | Admitting: Cardiology

## 2021-05-31 ENCOUNTER — Ambulatory Visit: Payer: Medicare HMO | Admitting: Cardiology

## 2021-05-31 ENCOUNTER — Other Ambulatory Visit: Payer: Self-pay

## 2021-05-31 VITALS — BP 134/76 | HR 62 | Ht 64.0 in | Wt 212.0 lb

## 2021-05-31 DIAGNOSIS — I10 Essential (primary) hypertension: Secondary | ICD-10-CM

## 2021-05-31 DIAGNOSIS — E7800 Pure hypercholesterolemia, unspecified: Secondary | ICD-10-CM | POA: Diagnosis not present

## 2021-05-31 DIAGNOSIS — I2510 Atherosclerotic heart disease of native coronary artery without angina pectoris: Secondary | ICD-10-CM

## 2021-05-31 DIAGNOSIS — I6529 Occlusion and stenosis of unspecified carotid artery: Secondary | ICD-10-CM

## 2021-05-31 MED ORDER — ISOSORBIDE MONONITRATE ER 30 MG PO TB24: 15.0000 mg | ORAL_TABLET | Freq: Every day | ORAL | 5 refills | Status: DC

## 2021-05-31 MED ORDER — CLOPIDOGREL BISULFATE 75 MG PO TABS
75.0000 mg | ORAL_TABLET | Freq: Every day | ORAL | 5 refills | Status: DC
Start: 2021-05-31 — End: 2022-12-14

## 2021-05-31 NOTE — Patient Instructions (Signed)
Medication Instructions:   Your physician has recommended you make the following change in your medication:    STOP taking your Aspirin.  2.    START taking Plavix 75 MG once a day.  3.    DECREASE Isosorbide (IMDUR) to 15 MG once a day. (0.5 tablet)  *If you need a refill on your cardiac medications before your next appointment, please call your pharmacy*   Lab Work: None ordered If you have labs (blood work) drawn today and your tests are completely normal, you will receive your results only by: MyChart Message (if you have MyChart) OR A paper copy in the mail If you have any lab test that is abnormal or we need to change your treatment, we will call you to review the results.   Testing/Procedures: None ordered   Follow-Up: At [facility], you and your health needs are our priority.  As part of our continuing mission to provide you with exceptional heart care, we have created designated Provider Care Teams.  These Care Teams include your primary Cardiologist (physician) and Advanced Practice Providers (APPs -  Physician Assistants and Nurse Practitioners) who all work together to provide you with the care you need, when you need it.  We recommend signing up for the patient portal called "MyChart".  Sign up information is provided on this After Visit Summary.  MyChart is used to connect with patients for Virtual Visits (Telemedicine).  Patients are able to view lab/test results, encounter notes, upcoming appointments, etc.  Non-urgent messages can be sent to your provider as well.   To learn more about what you can do with MyChart, go to [email or web address].    Your next appointment:   6-8 weeks   The format for your next appointment:   In Person  Provider:   Debbe Odea, MD  ONLY   Other Instructions

## 2021-05-31 NOTE — Progress Notes (Signed)
Cardiology Office Note:    Date:  05/31/2021   ID:  Tara Adams, DOB Dec 28, 1950, MRN [id number]  PCP:  [name], MD   [facility] Providers Cardiologist:  Debbe Odea, MD     Referring MD: [name], MD   Chief Complaint  Patient presents with   Other    Follow up post Cath. Meds reviewed verbally with patient.     History of Present Illness:    Tara Adams is a 70 y.o. female with a hx of CAD/CABG x 2 (2002), hypertension, hyperlipidemia who presents for follow-up.  Previously seen due to chest pain.  She underwent a Lexiscan Myoview which was abnormal.  Left heart cath was performed showing significant three-vessel native disease (CTO LAD, LCx, RCA).  Patent LIMA to LAD, SVG to left circumflex 90% stenosis at the distal insertion site.  RCA CTO, left-to-right collaterals.  Medical management was recommended.  Patient had a carotid ultrasound due to reporting right-sided weakness at last clinic visit.  He still has symptoms of chest pain especially with walking.  She was started on Imdur 30 mg daily, states not tolerating with occasional chest pain.  She takes nitroglycerin which resolved her chest pain.  Prior notes Echo 05/09/2021 EF 50 to 55%, impaired relaxation.  Mild MR. Left heart cath 05/2021 - significant three-vessel native disease (CTO LAD, Lcx, RCA).  Patent LIMA to LAD, SVG to left circumflex 90% stenosis at the distal insertion site.  Medical management recommended.  Past Medical History:  Diagnosis Date   Coronary artery disease    a. ~ 2002 s/p CABG x 2 ([facility]); b. 03/2021 MV: Inferior, inferolateral, mid and apical anterior ischemia. EF 41%. High risk study.   Hyperlipidemia    Hypertension    Morbid obesity (HCC)     Past Surgical History:  Procedure Laterality Date   CESAREAN SECTION     CORONARY ARTERY BYPASS GRAFT N/A    LEFT HEART CATH AND CORS/GRAFTS ANGIOGRAPHY N/A 05/20/2021   Procedure: LEFT HEART CATH AND  CORS/GRAFTS ANGIOGRAPHY;  Surgeon: Iran Ouch, MD;  Location: [facility] INVASIVE CV LAB;  Service: Cardiovascular;  Laterality: N/A;    Current Medications: Current Meds  Medication Sig   Ascorbic Acid (VITAMIN C) 1000 MG tablet Take 1,000 mg by mouth daily.   atorvastatin (LIPITOR) 80 MG tablet Take 1 tablet (80 mg total) by mouth daily.   butalbital-acetaminophen-caffeine (FIORICET) 50-325-40 MG tablet Take 1-2 tablets by mouth every 4 (four) hours as needed for headache.   Cholecalciferol (VITAMIN D) 50 MCG (2000 UT) CAPS Take 2,000 Units by mouth daily.   clopidogrel (PLAVIX) 75 MG tablet Take 1 tablet (75 mg total) by mouth daily.   gabapentin (NEURONTIN) 100 MG capsule Take 100 mg by mouth at bedtime.   hydrALAZINE (APRESOLINE) 50 MG tablet Take 1 tablet (50 mg total) by mouth 3 (three) times daily.   hydrocortisone cream 0.5 % Apply 1 application topically as needed for itching (Eczema).   ibuprofen (ADVIL) 200 MG tablet Take 400-800 mg by mouth every 6 (six) hours as needed for mild pain.   isosorbide mononitrate (IMDUR) 30 MG 24 hr tablet Take 0.5 tablets (15 mg total) by mouth daily.   metoprolol tartrate (LOPRESSOR) 100 MG tablet Take 100 mg by mouth 2 (two) times daily.   Multiple Minerals-Vitamins (CAL-MAG-ZINC-D PO) Take 1 tablet by mouth daily.   Multiple Vitamin (MULTIVITAMIN WITH MINERALS) TABS tablet Take 1 tablet by mouth daily.   nitroGLYCERIN (  NITROSTAT) 0.4 MG SL tablet Place 0.4 mg under the tongue every 5 (five) minutes as needed for chest pain.   tiZANidine (ZANAFLEX) 4 MG tablet Take 4 mg by mouth every 8 (eight) hours as needed for muscle spasms.   triamterene-hydrochlorothiazide (MAXZIDE) 75-50 MG tablet Take 1 tablet by mouth daily.   vitamin B-12 (CYANOCOBALAMIN) 1000 MCG tablet Take 1,000 mcg by mouth daily.   [DISCONTINUED] aspirin EC 81 MG tablet Take 81 mg by mouth daily. Swallow whole.     Allergies:   Codeine and Isosorbide   Social History    Socioeconomic History   Marital status: Single    Spouse name: Not on file   Number of children: Not on file   Years of education: Not on file   Highest education level: Not on file  Occupational History   Not on file  Tobacco Use   Smoking status: Former    Types: Cigarettes    Quit date: 05/29/1981    Years since quitting: 40.0   Smokeless tobacco: Never  Vaping Use   Vaping Use: Never used  Substance and Sexual Activity   Alcohol use: Never   Drug use: Never   Sexual activity: Not on file  Other Topics Concern   Not on file  Social History Narrative   Lives in [place].  Volunteers, driving elderly individuals.  Does not routinely exercise.   Social Determinants of Health   Financial Resource Strain: Not on file  Food Insecurity: Not on file  Transportation Needs: Not on file  Physical Activity: Not on file  Stress: Not on file  Social Connections: Not on file     Family History: The patient's family history is not on file.  ROS:   Please see the history of present illness.     All other systems reviewed and are negative.  EKGs/Labs/Other Studies Reviewed:    The following studies were reviewed today:   EKG:  EKG not  ordered today.   Recent Labs: 05/12/2021: BUN 27; Creatinine, Ser 1.02; Hemoglobin 11.7; Platelets 293; Potassium 4.2; Sodium 146  Recent Lipid Panel    Component Value Date/Time   CHOL 219 (H) 03/28/2021 1501   TRIG 177 (H) 03/28/2021 1501   HDL 58 03/28/2021 1501   CHOLHDL 3.8 03/28/2021 1501   LDLCALC 130 (H) 03/28/2021 1501     Risk Assessment/Calculations:     Physical Exam:    VS:  BP 134/76 (BP Location: Left Arm, Patient Position: Sitting, Cuff Size: Normal)   Pulse 62   Ht 5\' 4"  (1.626 m)   Wt 212 lb (96.2 kg)   SpO2 97%   BMI 36.39 kg/m     Wt Readings from Last 3 Encounters:  05/31/21 212 lb (96.2 kg)  05/20/21 210 lb (95.3 kg)  04/15/21 214 lb (97.1 kg)     GEN:  Well nourished, well developed in no  acute distress HEENT: Normal NECK: No JVD; No carotid bruits LYMPHATICS: No lymphadenopathy CARDIAC: RRR, 1/6 systolic murmurs, rubs, gallops RESPIRATORY:  Clear to auscultation without rales, wheezing or rhonchi  ABDOMEN: Soft, non-tender, non-distended MUSCULOSKELETAL:  No edema; No deformity  SKIN: Warm and dry NEUROLOGIC:  Alert and oriented x 3 PSYCHIATRIC:  Normal affect   ASSESSMENT:    1. Coronary artery disease involving native heart, unspecified vessel or lesion type, unspecified whether angina present   2. Primary hypertension   3. Pure hypercholesterolemia   4. Stenosis of carotid artery, unspecified laterality     PLAN:  In order of problems listed above:  History of CAD/CABG x2 (lima to LAD, SVG to OM in 2002).  LHC 2022 with patent lima to lad, 90%svg to OM.  Has symptoms consistent with angina.  Medical management advised.  Echo with EF 50-55%.  Start Imdur 15 mg daily, stop aspirin, start Plavix.  Continue, Lipitor, Lopressor 100 mg twice daily.  Titrate Imdur or start Ranexa at follow-up visit pending symptoms. Hypertension, BP controlled..  Continue Lopressor, HCTZ, start Imdur. Hyperlipidemia, LDL not at goal, Lipitor increased to 80 mg daily. History of right-sided weakness, carotid ultrasound 60 to 79% stenosis of left carotid artery.  Will refer to vascular surgery for additional input.  Aspirin, Plavix.  Follow-up in 6 weeks.    Medication Adjustments/Labs and Tests Ordered: Current medicines are reviewed at length with the patient today.  Concerns regarding medicines are outlined above.  Orders Placed This Encounter  Procedures   Ambulatory referral to Vascular Surgery     Meds ordered this encounter  Medications   clopidogrel (PLAVIX) 75 MG tablet    Sig: Take 1 tablet (75 mg total) by mouth daily.    Dispense:  30 tablet    Refill:  5   isosorbide mononitrate (IMDUR) 30 MG 24 hr tablet    Sig: Take 0.5 tablets (15 mg total) by mouth daily.     Dispense:  15 tablet    Refill:  5    DO NOT NEED TO REFILL AT THIS TIME. DOSE DECREASE.      Patient Instructions  Medication Instructions:   Your physician has recommended you make the following change in your medication:    STOP taking your Aspirin.  2.    START taking Plavix 75 MG once a day.  3.    DECREASE Isosorbide (IMDUR) to 15 MG once a day. (0.5 tablet)  *If you need a refill on your cardiac medications before your next appointment, please call your pharmacy*   Lab Work: None ordered If you have labs (blood work) drawn today and your tests are completely normal, you will receive your results only by: MyChart Message (if you have MyChart) OR A paper copy in the mail If you have any lab test that is abnormal or we need to change your treatment, we will call you to review the results.   Testing/Procedures: None ordered   Follow-Up: At [facility], you and your health needs are our priority.  As part of our continuing mission to provide you with exceptional heart care, we have created designated Provider Care Teams.  These Care Teams include your primary Cardiologist (physician) and Advanced Practice Providers (APPs -  Physician Assistants and Nurse Practitioners) who all work together to provide you with the care you need, when you need it.  We recommend signing up for the patient portal called "MyChart".  Sign up information is provided on this After Visit Summary.  MyChart is used to connect with patients for Virtual Visits (Telemedicine).  Patients are able to view lab/test results, encounter notes, upcoming appointments, etc.  Non-urgent messages can be sent to your provider as well.   To learn more about what you can do with MyChart, go to [email or web address].    Your next appointment:   6-8 weeks   The format for your next appointment:   In Person  Provider:   Debbe Odea, MD  ONLY   Other Instructions    Signed, Debbe Odea,  MD  05/31/2021 10:31 AM    [facility]

## 2021-06-01 ENCOUNTER — Telehealth: Payer: Self-pay | Admitting: Cardiology

## 2021-06-01 MED ORDER — ISOSORBIDE MONONITRATE ER 30 MG PO TB24: 15.0000 mg | ORAL_TABLET | Freq: Every day | ORAL | 1 refills | Status: DC

## 2021-06-01 MED ORDER — ISOSORBIDE MONONITRATE ER 30 MG PO TB24: 15.0000 mg | ORAL_TABLET | Freq: Every day | ORAL | 5 refills | Status: DC

## 2021-06-01 NOTE — Telephone Encounter (Signed)
Requested Prescriptions   Signed Prescriptions Disp Refills   isosorbide mononitrate (IMDUR) 30 MG 24 hr tablet 45 tablet 1    Sig: Take 0.5 tablets (15 mg total) by mouth daily.    Authorizing Provider: Debbe Odea    Ordering User: Thayer Headings, Ariadne Rissmiller L

## 2021-06-01 NOTE — Telephone Encounter (Signed)
*  STAT* If patient is at the pharmacy, call can be transferred to refill team.   1. Which medications need to be refilled? (please list name of each medication and dose if known)   Isosorbide 15 mg po q d   2. Which pharmacy/location (including street and city if local pharmacy) is medication to be sent to? [facility] [address]   3. Do they need a 30 day or 90 day supply? 90   Patient did not have old rx at home please

## 2021-06-21 ENCOUNTER — Encounter: Payer: Medicare HMO | Admitting: Vascular Surgery

## 2021-06-24 ENCOUNTER — Encounter: Payer: Medicare HMO | Admitting: Vascular Surgery

## 2021-07-03 NOTE — Progress Notes (Signed)
Office Note     CC: Bilateral carotid artery stenosis Requesting Provider:  [name], MD  HPI: Tara Adams is a 70 y.o. (Jun 10, 1951) female presenting at the request of .[name], MD for bilateral carotid artery stenosis.  Patient was seen by cardiology in August for angina.  During that visit, she described an episode of syncope and weakness several months prior in which she lost sensorimotor function in the right side.  This was transient and the patient made a full recovery, without seek medical attention.  Tara Adams underwent bilateral duplex ultrasonography demonstrating bilateral carotid artery stenosis for which she presents today.  On exam, Tara Adams was doing well.  She had no complaints.  She denied TIA, stroke, amaurosis since that event several months ago.  She has no symptoms of claudication, rest pain, lower extremity tissue loss.  She has a history of double bypass surgery, with most recent cardiac cath demonstrating Severe native three-vessel coronary artery disease with chronic total occlusion of the LAD, left circumflex, and RCA.  The pt is on a statin for cholesterol management.  The pt is on a daily aspirin.   Other AC:  plavix The pt is on on medications for hypertension.   The pt is not diabetic.  Tobacco hx:  remote  Past Medical History:  Diagnosis Date   Coronary artery disease    a. ~ 2002 s/p CABG x 2 ([facility]); b. 03/2021 MV: Inferior, inferolateral, mid and apical anterior ischemia. EF 41%. High risk study.   Hyperlipidemia    Hypertension    Morbid obesity (HCC)     Past Surgical History:  Procedure Laterality Date   CESAREAN SECTION     CORONARY ARTERY BYPASS GRAFT N/A    LEFT HEART CATH AND CORS/GRAFTS ANGIOGRAPHY N/A 05/20/2021   Procedure: LEFT HEART CATH AND CORS/GRAFTS ANGIOGRAPHY;  Surgeon: Iran Ouch, MD;  Location: [facility] INVASIVE CV LAB;  Service: Cardiovascular;  Laterality: N/A;    Social History    Socioeconomic History   Marital status: Single    Spouse name: Not on file   Number of children: Not on file   Years of education: Not on file   Highest education level: Not on file  Occupational History   Not on file  Tobacco Use   Smoking status: Former    Types: Cigarettes    Quit date: 05/29/1981    Years since quitting: 40.1   Smokeless tobacco: Never  Vaping Use   Vaping Use: Never used  Substance and Sexual Activity   Alcohol use: Never   Drug use: Never   Sexual activity: Not on file  Other Topics Concern   Not on file  Social History Narrative   Lives in [place].  Volunteers, driving elderly individuals.  Does not routinely exercise.   Social Determinants of Health   Financial Resource Strain: Not on file  Food Insecurity: Not on file  Transportation Needs: Not on file  Physical Activity: Not on file  Stress: Not on file  Social Connections: Not on file  Intimate Partner Violence: Not on file   No family history on file.  Current Outpatient Medications  Medication Sig Dispense Refill   Ascorbic Acid (VITAMIN C) 1000 MG tablet Take 1,000 mg by mouth daily.     atorvastatin (LIPITOR) 80 MG tablet Take 1 tablet (80 mg total) by mouth daily. 90 tablet 3   butalbital-acetaminophen-caffeine (FIORICET) 50-325-40 MG tablet Take 1-2 tablets by mouth every 4 (four) hours as  needed for headache.     Cholecalciferol (VITAMIN D) 50 MCG (2000 UT) CAPS Take 2,000 Units by mouth daily.     clopidogrel (PLAVIX) 75 MG tablet Take 1 tablet (75 mg total) by mouth daily. 30 tablet 5   doxycycline (VIBRAMYCIN) 100 MG capsule Take 100 mg by mouth 2 (two) times daily. (Patient not taking: No sig reported)     gabapentin (NEURONTIN) 100 MG capsule Take 100 mg by mouth at bedtime.     hydrALAZINE (APRESOLINE) 50 MG tablet Take 1 tablet (50 mg total) by mouth 3 (three) times daily. 270 tablet 0   hydrocortisone cream 0.5 % Apply 1 application topically as needed for itching  (Eczema).     ibuprofen (ADVIL) 200 MG tablet Take 400-800 mg by mouth every 6 (six) hours as needed for mild pain.     isosorbide mononitrate (IMDUR) 30 MG 24 hr tablet Take 0.5 tablets (15 mg total) by mouth daily. 45 tablet 1   metoprolol tartrate (LOPRESSOR) 100 MG tablet Take 100 mg by mouth 2 (two) times daily.     Multiple Minerals-Vitamins (CAL-MAG-ZINC-D PO) Take 1 tablet by mouth daily.     Multiple Vitamin (MULTIVITAMIN WITH MINERALS) TABS tablet Take 1 tablet by mouth daily.     nitroGLYCERIN (NITROSTAT) 0.4 MG SL tablet Place 0.4 mg under the tongue every 5 (five) minutes as needed for chest pain.     tiZANidine (ZANAFLEX) 4 MG tablet Take 4 mg by mouth every 8 (eight) hours as needed for muscle spasms.     triamterene-hydrochlorothiazide (MAXZIDE) 75-50 MG tablet Take 1 tablet by mouth daily.     vitamin B-12 (CYANOCOBALAMIN) 1000 MCG tablet Take 1,000 mcg by mouth daily.     No current facility-administered medications for this visit.    Allergies  Allergen Reactions   Codeine Itching   Isosorbide Other (See Comments)    Tightness in her chest     REVIEW OF SYSTEMS:   [X]  denotes positive finding, [ ]  denotes negative finding Cardiac  Comments:  Chest pain or chest pressure:    Shortness of breath upon exertion:    Short of breath when lying flat:    Irregular heart rhythm:        Vascular    Pain in calf, thigh, or hip brought on by ambulation:    Pain in feet at night that wakes you up from your sleep:     Blood clot in your veins:    Leg swelling:         Pulmonary    Oxygen at home:    Productive cough:     Wheezing:         Neurologic    Sudden weakness in arms or legs:     Sudden numbness in arms or legs:     Sudden onset of difficulty speaking or slurred speech:    Temporary loss of vision in one eye:     Problems with dizziness:         Gastrointestinal    Blood in stool:     Vomited blood:         Genitourinary    Burning when urinating:      Blood in urine:        Psychiatric    Major depression:         Hematologic    Bleeding problems:    Problems with blood clotting too easily:        Skin  Rashes or ulcers:        Constitutional    Fever or chills:      PHYSICAL EXAMINATION:  There were no vitals filed for this visit.  General:  WDWN in NAD; vital signs documented above Gait: Not observed HENT: WNL, normocephalic Pulmonary: normal non-labored breathing , without Rales, rhonchi,  wheezing Cardiac: regular HR,  Abdomen: soft, NT, no masses Skin: without rashes Vascular Exam/Pulses:  Right Left  Radial 2+ (normal) 2+ (normal)  Ulnar 2+ (normal) 2+ (normal)  Femoral    Popliteal    DP 2+ (normal) 2+ (normal)       Extremities: without ischemic changes, without Gangrene , without cellulitis; without open wounds;  Musculoskeletal: no muscle wasting or atrophy  Neurologic: A&O X 3;  No focal weakness or paresthesias are detected Psychiatric:  The pt has Normal affect.   Non-Invasive Vascular Imaging:   Summary:  Right Carotid: Velocities in the right ICA are consistent with a 1-39%  stenosis.                 Non-hemodynamically significant plaque <50% noted in the  CCA. The                 ECA appears <50% stenosed.   Left Carotid: Velocities in the left ICA are consistent with a 60-79%  stenosis.                Non-hemodynamically significant plaque <50% noted in the  CCA. The                ECA appears <50% stenosed.   Vertebrals:  Bilateral vertebral arteries demonstrate antegrade flow.  Subclavians: Normal flow hemodynamics were seen in bilateral subclavian               arteries.   Cardiac Cath -   Prox RCA to Mid RCA lesion is 100% stenosed.   Ost Cx to Prox Cx lesion is 100% stenosed.   Prox LAD to Mid LAD lesion is 100% stenosed.   Dist SVG to LCx to Insertion lesion is 95% stenosed.   Dist LAD lesion is 80% stenosed.   Conclusion: Severe native three-vessel coronary artery  disease with chronic total occlusion of the LAD, left circumflex, and RCA. Patent LIMA to LAD that supplies collaterals to the RCA system. SVG to left circumflex has a 90% stenosis at the distal insertion site supplying a diffusely diseased circumflex vessel. Overall, small territory. The RCA is unbypassed and chronically occluded supplied by left-to-right collaterals.   Recommendations: Medical management of angina.  The benefit of revascularizing the distal SVG graft is minimal and is high risk for occlusion of the branch vessels. Aspirin 81 mg indefinitely. Aggressive secondary prevention.    ASSESSMENT/PLAN:: 70 y.o. female presenting with history of transient right-sided weakness.  The patient did not present to the hospital after this event.  Ultrasound imaging was independently reviewed demonstrating greater than 60-79% stenosis in the left internal carotid artery.   If the patient were asymptomatic, I would not offer her carotid revascularization with this level of stenosis.  Her history of TIA is concerning.   I discussed with her, that carotid revascularization for symptomatic carotid artery stenosis would lower her risk of future cerebrovascular events.   I cited literature from the NASCET trial demonstrating a 6% decrease in stroke rate in patients with 50 to 69% stenosis at 2 years with intervention versus medical management.  Unfortunately, Tara Adams's medical comorbidities are significant with severe  three-vessel coronary disease.  While carotid intervention would decrease her stroke risk, the procedure itself could cause a coronary event.  I will talk to Dr. Kirke Corin about the above findings and will ask him to see Tara Adams for preoperative risk assessment.   In the interim I will order a CT angio head and neck to assess anatomy of the lesion for preoperative planning.   Amalie is currently on best medical therapy -aspirin, Plavix, high intensity statin.  I will see the  patient in the office after her CT angiogram and cardiology appointment to further our discussion.  She was asked to seek immediate medical attention should Tara TIA, stroke, amaurosis occur.   Victorino Sparrow, MD [facility] [phone]

## 2021-07-04 ENCOUNTER — Other Ambulatory Visit: Payer: Self-pay

## 2021-07-04 ENCOUNTER — Ambulatory Visit: Payer: Medicare HMO | Admitting: Vascular Surgery

## 2021-07-04 ENCOUNTER — Encounter: Payer: Self-pay | Admitting: Vascular Surgery

## 2021-07-04 VITALS — BP 146/86 | HR 60 | Temp 98.2°F | Resp 20 | Ht 64.0 in | Wt 213.0 lb

## 2021-07-04 DIAGNOSIS — I6523 Occlusion and stenosis of bilateral carotid arteries: Secondary | ICD-10-CM

## 2021-07-08 ENCOUNTER — Encounter: Payer: Medicare HMO | Admitting: Vascular Surgery

## 2021-07-08 ENCOUNTER — Other Ambulatory Visit: Payer: Self-pay

## 2021-07-08 DIAGNOSIS — I6523 Occlusion and stenosis of bilateral carotid arteries: Secondary | ICD-10-CM

## 2021-07-15 ENCOUNTER — Encounter: Payer: Self-pay | Admitting: Cardiology

## 2021-07-15 ENCOUNTER — Ambulatory Visit: Payer: Medicare HMO | Admitting: Vascular Surgery

## 2021-07-15 ENCOUNTER — Other Ambulatory Visit: Payer: Self-pay

## 2021-07-15 ENCOUNTER — Ambulatory Visit: Payer: Medicare HMO | Admitting: Cardiology

## 2021-07-15 VITALS — BP 140/80 | HR 56 | Wt 215.0 lb

## 2021-07-15 DIAGNOSIS — I6529 Occlusion and stenosis of unspecified carotid artery: Secondary | ICD-10-CM

## 2021-07-15 DIAGNOSIS — I2510 Atherosclerotic heart disease of native coronary artery without angina pectoris: Secondary | ICD-10-CM

## 2021-07-15 DIAGNOSIS — I10 Essential (primary) hypertension: Secondary | ICD-10-CM

## 2021-07-15 DIAGNOSIS — E7800 Pure hypercholesterolemia, unspecified: Secondary | ICD-10-CM

## 2021-07-15 MED ORDER — ISOSORBIDE MONONITRATE ER 30 MG PO TB24: 30.0000 mg | ORAL_TABLET | Freq: Every day | ORAL | 1 refills | Status: DC

## 2021-07-15 NOTE — Patient Instructions (Signed)
Medication Instructions:   Your physician has recommended you make the following change in your medication:    INCREASE your Isosorbide mononitrate (IMDUR)  to 30 MG once a day.  *If you need a refill on your cardiac medications before your next appointment, please call your pharmacy*   Lab Work: None ordered If you have labs (blood work) drawn today and your tests are completely normal, you will receive your results only by: MyChart Message (if you have MyChart) OR A paper copy in the mail If you have any lab test that is abnormal or we need to change your treatment, we will call you to review the results.   Testing/Procedures: None ordered   Follow-Up: At [facility], you and your health needs are our priority.  As part of our continuing mission to provide you with exceptional heart care, we have created designated Provider Care Teams.  These Care Teams include your primary Cardiologist (physician) and Advanced Practice Providers (APPs -  Physician Assistants and Nurse Practitioners) who all work together to provide you with the care you need, when you need it.  We recommend signing up for the patient portal called "MyChart".  Sign up information is provided on this After Visit Summary.  MyChart is used to connect with patients for Virtual Visits (Telemedicine).  Patients are able to view lab/test results, encounter notes, upcoming appointments, etc.  Non-urgent messages can be sent to your provider as well.   To learn more about what you can do with MyChart, go to [email or web address].    Your next appointment:   3 month(s)  The format for your next appointment:   In Person  Provider:   You may see Debbe Odea, MD or one of the following Advanced Practice Providers on your designated Care Team:   Nicolasa Ducking, NP Eula Listen, PA-C Marisue Ivan, PA-C Cadence Fransico [place], [place]   Other Instructions

## 2021-07-15 NOTE — Progress Notes (Signed)
Cardiology Office Note:    Date:  07/15/2021   ID:  Tara Adams, DOB 07/17/1951, MRN [id number]  PCP:  [name], MD   [facility] Providers Cardiologist:  Debbe Odea, MD     Referring MD: [name], MD   Chief Complaint  Patient presents with   Other    6 month follow up. Patient states the chest pain and SOB has improved since starting imdur. Meds reviewed verbally with patient.     History of Present Illness:    Tara Adams is a 70 y.o. female with a hx of CAD/CABG x 2 (2002), hypertension, hyperlipidemia, left carotid artery stenosis( 60-79%) who presents for follow-up.    Previously seen due to chest pain.  Started on Imdur with improvement in chest pain symptoms with walking.  She states she is able to walk further. Left heart cath 04/2021 was performed showing significant three-vessel native disease (CTO LAD, LCx, RCA).  Patent LIMA to LAD, SVG to left circumflex 90% stenosis at the distal insertion site.  RCA CTO, left-to-right collaterals.  Medical management was recommended.  She has significant left carotid artery disease, surgical management is being considered.  Did not tolerate higher doses of Inderal, this was reduced to 15 mg daily.  States shortness of breath and chest pain is much improved after starting Imdur, is more willing to take higher doses of Imdur..  Lipitor was increased to 80 mg daily after last visit for better cholesterol control.   Prior notes Echo 05/09/2021 EF 50 to 55%, impaired relaxation.  Mild MR. Left heart cath 05/2021 - significant three-vessel native disease (CTO LAD, Lcx, RCA).  Patent LIMA to LAD, SVG to left circumflex 90% stenosis at the distal insertion site.  Medical management recommended.  Past Medical History:  Diagnosis Date   Coronary artery disease    a. ~ 2002 s/p CABG x 2 ([facility]); b. 03/2021 MV: Inferior, inferolateral, mid and apical anterior ischemia. EF 41%. High risk study.    Hyperlipidemia    Hypertension    Morbid obesity (HCC)     Past Surgical History:  Procedure Laterality Date   CESAREAN SECTION     CORONARY ARTERY BYPASS GRAFT N/A    LEFT HEART CATH AND CORS/GRAFTS ANGIOGRAPHY N/A 05/20/2021   Procedure: LEFT HEART CATH AND CORS/GRAFTS ANGIOGRAPHY;  Surgeon: Iran Ouch, MD;  Location: [facility] INVASIVE CV LAB;  Service: Cardiovascular;  Laterality: N/A;    Current Medications: Current Meds  Medication Sig   Ascorbic Acid (VITAMIN C) 1000 MG tablet Take 1,000 mg by mouth daily.   atorvastatin (LIPITOR) 80 MG tablet Take 1 tablet (80 mg total) by mouth daily.   butalbital-acetaminophen-caffeine (FIORICET) 50-325-40 MG tablet Take 1-2 tablets by mouth every 4 (four) hours as needed for headache.   Cholecalciferol (VITAMIN D) 50 MCG (2000 UT) CAPS Take 2,000 Units by mouth daily.   clopidogrel (PLAVIX) 75 MG tablet Take 1 tablet (75 mg total) by mouth daily.   gabapentin (NEURONTIN) 100 MG capsule Take 100 mg by mouth at bedtime.   hydrALAZINE (APRESOLINE) 50 MG tablet Take 1 tablet (50 mg total) by mouth 3 (three) times daily.   hydrocortisone cream 0.5 % Apply 1 application topically as needed for itching (Eczema).   ibuprofen (ADVIL) 200 MG tablet Take 400-800 mg by mouth every 6 (six) hours as needed for mild pain.   metoprolol tartrate (LOPRESSOR) 100 MG tablet Take 100 mg by mouth 2 (two) times daily.  Multiple Vitamin (MULTIVITAMIN WITH MINERALS) TABS tablet Take 1 tablet by mouth daily.   nitroGLYCERIN (NITROSTAT) 0.4 MG SL tablet Place 0.4 mg under the tongue every 5 (five) minutes as needed for chest pain.   tiZANidine (ZANAFLEX) 4 MG tablet Take 4 mg by mouth every 8 (eight) hours as needed for muscle spasms.   triamterene-hydrochlorothiazide (MAXZIDE) 75-50 MG tablet Take 1 tablet by mouth daily.   vitamin B-12 (CYANOCOBALAMIN) 1000 MCG tablet Take 1,000 mcg by mouth daily.   [DISCONTINUED] isosorbide mononitrate (IMDUR) 30 MG 24 hr tablet  Take 0.5 tablets (15 mg total) by mouth daily.     Allergies:   Codeine   Social History   Socioeconomic History   Marital status: Single    Spouse name: Not on file   Number of children: Not on file   Years of education: Not on file   Highest education level: Not on file  Occupational History   Not on file  Tobacco Use   Smoking status: Former    Types: Cigarettes    Quit date: 05/29/1981    Years since quitting: 40.1   Smokeless tobacco: Never  Vaping Use   Vaping Use: Never used  Substance and Sexual Activity   Alcohol use: Never   Drug use: Never   Sexual activity: Not on file  Other Topics Concern   Not on file  Social History Narrative   Lives in [place].  Volunteers, driving elderly individuals.  Does not routinely exercise.   Social Determinants of Health   Financial Resource Strain: Not on file  Food Insecurity: Not on file  Transportation Needs: Not on file  Physical Activity: Not on file  Stress: Not on file  Social Connections: Not on file     Family History: The patient's family history is not on file.  ROS:   Please see the history of present illness.     All other systems reviewed and are negative.  EKGs/Labs/Other Studies Reviewed:    The following studies were reviewed today:   EKG:  EKG is ordered today.  EKG shows sinus bradycardia, heart rate 56.  Recent Labs: 05/12/2021: BUN 27; Creatinine, Ser 1.02; Hemoglobin 11.7; Platelets 293; Potassium 4.2; Sodium 146  Recent Lipid Panel    Component Value Date/Time   CHOL 219 (H) 03/28/2021 1501   TRIG 177 (H) 03/28/2021 1501   HDL 58 03/28/2021 1501   CHOLHDL 3.8 03/28/2021 1501   LDLCALC 130 (H) 03/28/2021 1501     Risk Assessment/Calculations:     Physical Exam:    VS:  BP 140/80 (BP Location: Left Arm, Patient Position: Sitting, Cuff Size: Normal)   Pulse (!) 56   Wt 215 lb (97.5 kg)   BMI 36.90 kg/m     Wt Readings from Last 3 Encounters:  07/15/21 215 lb (97.5 kg)   07/04/21 213 lb (96.6 kg)  05/31/21 212 lb (96.2 kg)     GEN:  Well nourished, well developed in no acute distress HEENT: Normal NECK: No JVD; No carotid bruits LYMPHATICS: No lymphadenopathy CARDIAC: RRR, 1/6 systolic murmurs, rubs, gallops RESPIRATORY:  Clear to auscultation without rales, wheezing or rhonchi  ABDOMEN: Soft, non-tender, non-distended MUSCULOSKELETAL:  No edema; No deformity  SKIN: Warm and dry NEUROLOGIC:  Alert and oriented x 3 PSYCHIATRIC:  Normal affect   ASSESSMENT:    1. Coronary artery disease involving native heart, unspecified vessel or lesion type, unspecified whether angina present   2. Primary hypertension   3. Pure hypercholesterolemia  4. Stenosis of carotid artery, unspecified laterality      PLAN:    In order of problems listed above:  History of CAD/CABG x2 (lima to LAD, SVG to OM in 2002).  LHC 2022 with patent lima to lad, 90%svg to OM.  Medical management advised.  Anginal symptoms improved with Imdur.  Increase Imdur to 30 mg daily.  .  Echo with EF 50-55%.  Continue Plavix.  Continue, Lipitor, Lopressor 100 mg twice daily.   Hypertension, BP controlled..  Continue Lopressor, HCTZ, Imdur. Hyperlipidemia, LDL not at goal, Lipitor 80 mg daily. History of right-sided weakness, carotid ultrasound 60 to 79% stenosis of left carotid artery.  Denies chest pain, patient has has stable chronic cardiac disease.  Last EF was normal.  No additional cardiac testing or intervention will mitigate patient's risk.  Patient can proceed with CEA from a cardiac perspective.  Continue Plavix, statin as prescribed.  Follow-up in 3 months.    Medication Adjustments/Labs and Tests Ordered: Current medicines are reviewed at length with the patient today.  Concerns regarding medicines are outlined above.  Orders Placed This Encounter  Procedures   EKG 12-Lead      Meds ordered this encounter  Medications   isosorbide mononitrate (IMDUR) 30 MG 24 hr  tablet    Sig: Take 1 tablet (30 mg total) by mouth daily.    Dispense:  90 tablet    Refill:  1    DOSE DECREASE.       Patient Instructions  Medication Instructions:   Your physician has recommended you make the following change in your medication:    INCREASE your Isosorbide mononitrate (IMDUR)  to 30 MG once a day.  *If you need a refill on your cardiac medications before your next appointment, please call your pharmacy*   Lab Work: None ordered If you have labs (blood work) drawn today and your tests are completely normal, you will receive your results only by: MyChart Message (if you have MyChart) OR A paper copy in the mail If you have any lab test that is abnormal or we need to change your treatment, we will call you to review the results.   Testing/Procedures: None ordered   Follow-Up: At [facility], you and your health needs are our priority.  As part of our continuing mission to provide you with exceptional heart care, we have created designated Provider Care Teams.  These Care Teams include your primary Cardiologist (physician) and Advanced Practice Providers (APPs -  Physician Assistants and Nurse Practitioners) who all work together to provide you with the care you need, when you need it.  We recommend signing up for the patient portal called "MyChart".  Sign up information is provided on this After Visit Summary.  MyChart is used to connect with patients for Virtual Visits (Telemedicine).  Patients are able to view lab/test results, encounter notes, upcoming appointments, etc.  Non-urgent messages can be sent to your provider as well.   To learn more about what you can do with MyChart, go to [email or web address].    Your next appointment:   3 month(s)  The format for your next appointment:   In Person  Provider:   You may see Debbe Odea, MD or one of the following Advanced Practice Providers on your designated Care Team:   Nicolasa Ducking,  NP Eula Listen, PA-C Marisue Ivan, PA-C Cadence [place], [place]   Other Instructions    Signed, Debbe Odea, MD  07/15/2021 4:52 PM  [facility]

## 2021-07-19 ENCOUNTER — Other Ambulatory Visit: Payer: Self-pay

## 2021-07-19 ENCOUNTER — Ambulatory Visit (HOSPITAL_COMMUNITY)
Admission: RE | Admit: 2021-07-19 | Discharge: 2021-07-19 | Disposition: A | Discharge status: Home / Self Care | Payer: Medicare HMO | Source: Ambulatory Visit | Attending: Vascular Surgery | Admitting: Vascular Surgery

## 2021-07-19 DIAGNOSIS — I6523 Occlusion and stenosis of bilateral carotid arteries: Secondary | ICD-10-CM | POA: Insufficient documentation

## 2021-07-19 LAB — POCT I-STAT CREATININE: Creatinine, Ser: 1.1 mg/dL — ABNORMAL HIGH (ref 0.44–1.00)

## 2021-07-19 MED ORDER — IOHEXOL 350 MG/ML SOLN
80.0000 mL | Freq: Once | INTRAVENOUS | Status: AC | PRN
  Administered 2021-07-19: 80 mL via INTRAVENOUS

## 2021-07-24 NOTE — Progress Notes (Signed)
Office Note     CC: Bilateral carotid artery stenosis Requesting Provider:  [name], MD  HPI: Tara Adams is a 70 y.o. (1951-02-05) female presenting in follow-up for symptomatic left-sided internal carotid artery stenosis.   Patient was seen by cardiology in August for angina.  During that visit, she described an episode of syncope and weakness several months prior in which she lost sensorimotor function in Tara right side.  This was transient and Tara patient made a full recovery, without seek medical attention.  Tara Adams underwent bilateral duplex ultrasonography demonstrating bilateral carotid artery stenosis.  Since last seen, Tara Adams underwent CT angio neck and head to define her vascular anatomy. On exam, Tara Adams was doing well.  She had no complaints.  She denied TIA, stroke, amaurosis since that event several months ago.  She has no symptoms of claudication, rest pain, lower extremity tissue loss.  She has a history of double bypass surgery, with most recent cardiac cath demonstrating Severe native three-vessel coronary artery disease with chronic total occlusion of Tara LAD, left circumflex, and RCA.  Tara Adams is on a statin for cholesterol management.  Tara Adams is on a daily aspirin.   Other AC:  plavix Tara Adams is on on medications for hypertension.   Tara Adams is not diabetic.  Tobacco hx:  remote  Past Medical History:  Diagnosis Date   Coronary artery disease    a. ~ 2002 s/p CABG x 2 ([facility]); b. 03/2021 MV: Inferior, inferolateral, mid and apical anterior ischemia. EF 41%. High risk study.   Hyperlipidemia    Hypertension    Morbid obesity (HCC)     Past Surgical History:  Procedure Laterality Date   CESAREAN SECTION     CORONARY ARTERY BYPASS GRAFT N/A    LEFT HEART CATH AND CORS/GRAFTS ANGIOGRAPHY N/A 05/20/2021   Procedure: LEFT HEART CATH AND CORS/GRAFTS ANGIOGRAPHY;  Surgeon: Iran Ouch, MD;  Location: [facility] INVASIVE CV LAB;  Service:  Cardiovascular;  Laterality: N/A;    Social History   Socioeconomic History   Marital status: Single    Spouse name: Not on file   Number of children: Not on file   Years of education: Not on file   Highest education level: Not on file  Occupational History   Not on file  Tobacco Use   Smoking status: Former    Types: Cigarettes    Quit date: 05/29/1981    Years since quitting: 40.1   Smokeless tobacco: Never  Vaping Use   Vaping Use: Never used  Substance and Sexual Activity   Alcohol use: Never   Drug use: Never   Sexual activity: Not on file  Other Topics Concern   Not on file  Social History Narrative   Lives in [place].  Volunteers, driving elderly individuals.  Does not routinely exercise.   Social Determinants of Health   Financial Resource Strain: Not on file  Food Insecurity: Not on file  Transportation Needs: Not on file  Physical Activity: Not on file  Stress: Not on file  Social Connections: Not on file  Intimate Partner Violence: Not on file   No family history on file.  Current Outpatient Medications  Medication Sig Dispense Refill   Ascorbic Acid (VITAMIN C) 1000 MG tablet Take 1,000 mg by mouth daily.     atorvastatin (LIPITOR) 80 MG tablet Take 1 tablet (80 mg total) by mouth daily. 90 tablet 3   butalbital-acetaminophen-caffeine (FIORICET) 50-325-40 MG tablet Take 1-2 tablets  by mouth every 4 (four) hours as needed for headache.     Cholecalciferol (VITAMIN D) 50 MCG (2000 UT) CAPS Take 2,000 Units by mouth daily.     clopidogrel (PLAVIX) 75 MG tablet Take 1 tablet (75 mg total) by mouth daily. 30 tablet 5   gabapentin (NEURONTIN) 100 MG capsule Take 100 mg by mouth at bedtime.     hydrALAZINE (APRESOLINE) 50 MG tablet Take 1 tablet (50 mg total) by mouth 3 (three) times daily. 270 tablet 0   hydrocortisone cream 0.5 % Apply 1 application topically as needed for itching (Eczema).     ibuprofen (ADVIL) 200 MG tablet Take 400-800 mg by mouth every  6 (six) hours as needed for mild pain.     isosorbide mononitrate (IMDUR) 30 MG 24 hr tablet Take 1 tablet (30 mg total) by mouth daily. 90 tablet 1   metoprolol tartrate (LOPRESSOR) 100 MG tablet Take 100 mg by mouth 2 (two) times daily.     Multiple Vitamin (MULTIVITAMIN WITH MINERALS) TABS tablet Take 1 tablet by mouth daily.     nitroGLYCERIN (NITROSTAT) 0.4 MG SL tablet Place 0.4 mg under Tara tongue every 5 (five) minutes as needed for chest pain.     tiZANidine (ZANAFLEX) 4 MG tablet Take 4 mg by mouth every 8 (eight) hours as needed for muscle spasms.     triamterene-hydrochlorothiazide (MAXZIDE) 75-50 MG tablet Take 1 tablet by mouth daily.     vitamin B-12 (CYANOCOBALAMIN) 1000 MCG tablet Take 1,000 mcg by mouth daily.     No current facility-administered medications for this visit.    Allergies  Allergen Reactions   Codeine Itching     REVIEW OF SYSTEMS:   [X]  denotes positive finding, [ ]  denotes negative finding Cardiac  Comments:  Chest pain or chest pressure:    Shortness of breath upon exertion:    Short of breath when lying flat:    Irregular heart rhythm:        Vascular    Pain in calf, thigh, or hip brought on by ambulation:    Pain in feet at night that wakes you up from your sleep:     Blood clot in your veins:    Leg swelling:         Pulmonary    Oxygen at home:    Productive cough:     Wheezing:         Neurologic    Sudden weakness in arms or legs:     Sudden numbness in arms or legs:     Sudden onset of difficulty speaking or slurred speech:    Temporary loss of vision in one eye:     Problems with dizziness:         Gastrointestinal    Blood in stool:     Vomited blood:         Genitourinary    Burning when urinating:     Blood in urine:        Psychiatric    Major depression:         Hematologic    Bleeding problems:    Problems with blood clotting too easily:        Skin    Rashes or ulcers:        Constitutional    Fever or  chills:      PHYSICAL EXAMINATION:  There were no vitals filed for this visit.  General:  WDWN in NAD; vital signs documented  above Gait: Not observed HENT: WNL, normocephalic Pulmonary: normal non-labored breathing , without Rales, rhonchi,  wheezing Cardiac: regular HR,  Abdomen: soft, NT, no masses Skin: without rashes Vascular Exam/Pulses:  Right Left  Radial 2+ (normal) 2+ (normal)  Ulnar 2+ (normal) 2+ (normal)  Femoral    Popliteal    DP 2+ (normal) 2+ (normal)       Extremities: without ischemic changes, without Gangrene , without cellulitis; without open wounds;  Musculoskeletal: no muscle wasting or atrophy  Neurologic: A&O X 3;  No focal weakness or paresthesias are detected Psychiatric:  Tara Adams has Normal affect.   Non-Invasive Vascular Imaging:   Summary:  Right Carotid: Velocities in Tara right ICA are consistent with a 1-39%  stenosis.                 Non-hemodynamically significant plaque <50% noted in Tara  CCA. Tara                 ECA appears <50% stenosed.   Left Carotid: Velocities in Tara left ICA are consistent with a 60-79%  stenosis.                Non-hemodynamically significant plaque <50% noted in Tara  CCA. Tara                ECA appears <50% stenosed.   Vertebrals:  Bilateral vertebral arteries demonstrate antegrade flow.  Subclavians: Normal flow hemodynamics were seen in bilateral subclavian               arteries.   Cardiac Cath -   Prox RCA to Mid RCA lesion is 100% stenosed.   Ost Cx to Prox Cx lesion is 100% stenosed.   Prox LAD to Mid LAD lesion is 100% stenosed.   Dist SVG to LCx to Insertion lesion is 95% stenosed.   Dist LAD lesion is 80% stenosed.   Conclusion: Severe native three-vessel coronary artery disease with chronic total occlusion of Tara LAD, left circumflex, and RCA. Patent LIMA to LAD that supplies collaterals to Tara RCA system. SVG to left circumflex has a 90% stenosis at Tara distal insertion site supplying a  diffusely diseased circumflex vessel. Overall, small territory. Tara RCA is unbypassed and chronically occluded supplied by left-to-right collaterals.   Recommendations: Medical management of angina.  Tara benefit of revascularizing Tara distal SVG graft is minimal and is high risk for occlusion of Tara branch vessels. Aspirin 81 mg indefinitely. Aggressive secondary prevention.  Most Recent: IMPRESSION: Atherosclerotic disease at both carotid bifurcations. No stenosis on Tara right. 67% stenosis of Tara proximal ICA on Tara left due to soft and calcified plaque.   Atherosclerotic calcification in both carotid siphon regions but without stenosis greater than 30%.   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 Beyond that, Tara right vertebral artery is patent to Tara basilar. Tara basilar artery shows serial 30% stenoses. Posterior circulation branch vessels do show flow. There are patent bilateral posterior communicating arteries, significantly protective in this situation.    ASSESSMENT/PLAN:: 70 y.o. female presenting with history of symptomatic left internal carotid artery stenosis.  Ultrasound imaging was independently reviewed demonstrating greater than 60-79% stenosis in Tara left internal carotid artery.  Tara Adams underwent CT angio of Tara neck and head to further define her vascular anatomy, which also demonstrated a 70% stenosis of Tara left proximal ICA.   I cited literature from Tara NASCET trial demonstrating a 6%  decrease in stroke rate in patients with 50 to 69% stenosis at 2 years with intervention versus medical management.  Patient has Tara anatomy for transcarotid artery revascularization.  I am happy to offer left TCAR to lower her stroke risk, however Tara Adams medical comorbidities are significant with severe three-vessel coronary disease.  While carotid intervention would decrease her stroke risk, Tara procedure  itself could cause a coronary event. Prior to surgery, Tara Adams would benefit from seeing her cardiologist.  I will schedule her for surgery once cleared by Dr. Myriam Forehand- Sandie Ano.  She was asked to seek immediate medical attention should new TIA, stroke, amaurosis occur.   Tara Sparrow, MD [facility] [phone]

## 2021-07-25 ENCOUNTER — Ambulatory Visit: Payer: Medicare HMO | Admitting: Vascular Surgery

## 2021-07-25 ENCOUNTER — Other Ambulatory Visit: Payer: Self-pay

## 2021-07-25 ENCOUNTER — Encounter: Payer: Self-pay | Admitting: Vascular Surgery

## 2021-07-25 VITALS — BP 148/66 | HR 58 | Temp 97.9°F | Resp 16 | Ht 65.0 in | Wt 214.0 lb

## 2021-07-25 DIAGNOSIS — I6523 Occlusion and stenosis of bilateral carotid arteries: Secondary | ICD-10-CM | POA: Diagnosis not present

## 2021-07-27 ENCOUNTER — Telehealth: Payer: Self-pay | Admitting: Cardiology

## 2021-07-27 NOTE — Telephone Encounter (Signed)
    Patient Name: Tara Adams  DOB: 1951/05/21 MRN: [id number]  Primary Cardiologist: Debbe Odea, MD  Chart reviewed as part of pre-operative protocol coverage. Patient was recently seen by Dr. Azucena Cecil on 07/15/2021 at which time patient mentioned that surgical management of significant left carotid disease was being considered. Per Dr. Merita Norton office visit note, "No additional cardiac testing or intervention will mitigate patient's risk. Patient can proceed with CEA from a cardiac perspective."  I will route this recommendation to the requesting party via Epic fax function and remove from pre-op pool.  Please call with questions.  Corrin Parker, PA-C 07/27/2021, 3:15 PM

## 2021-07-27 NOTE — Telephone Encounter (Signed)
   [facility] Pre-operative Risk Assessment    Patient Name: Tara Adams  DOB: Oct 31, 1950 MRN: [id number]  HEARTCARE STAFF:  - IMPORTANT!!!!!! Under Visit Info/Reason for Call, type in Other and utilize the format Clearance MM/DD/YY or Clearance TBD. Do not use dashes or single digits. - Please review there is not already an duplicate clearance open for this procedure. - If request is for dental extraction, please clarify the # of teeth to be extracted. - If the patient is currently at the dentist's office, call Pre-Op Callback Staff (MA/nurse) to input urgent request.  - If the patient is not currently in the dentist office, please route to the Pre-Op pool.  Request for surgical clearance:  What type of surgery is being performed? Left trans carotid artery  revascularization   When is this surgery scheduled? Tbd   What type of clearance is required (medical clearance vs. Pharmacy clearance to hold med vs. Both)? Medical   Are there any medications that need to be held prior to surgery and how long? Not noted   Practice name and name of physician performing surgery? [facility]   What is the office phone number? [phone]   7.   What is the office fax number? [phone]  8.   Anesthesia type (None, local, MAC, general) ? Not noted    Clarisse Gouge 07/27/2021, 1:16 PM  _________________________________________________________________   (provider comments below)

## 2021-08-05 ENCOUNTER — Other Ambulatory Visit: Payer: Self-pay

## 2021-08-05 DIAGNOSIS — I6523 Occlusion and stenosis of bilateral carotid arteries: Secondary | ICD-10-CM

## 2021-08-08 ENCOUNTER — Other Ambulatory Visit: Payer: Self-pay

## 2021-08-08 ENCOUNTER — Ambulatory Visit: Payer: Medicare HMO | Admitting: Cardiology

## 2021-08-09 ENCOUNTER — Telehealth: Payer: Self-pay | Admitting: Cardiology

## 2021-08-09 ENCOUNTER — Telehealth: Payer: Self-pay

## 2021-08-09 NOTE — Telephone Encounter (Signed)
Patient calls today c/o vaginal bleeding since the addition of aspirin to her medication regime. Says the bleeding is getting worse and is going through a pad a day. No other issues. She has decided not to take aspirin anymore but will continue the Plavix. Patient should follow up with GYN. Advised I would let MD know.

## 2021-08-09 NOTE — Telephone Encounter (Signed)
I spoke with the patient. She states that Dr. Karin Lieu (Vascular Surgery) put her on ASA 81 mg once daily in addition to her Plavix 75 mg once daily ~ 1 month ago.  She noticed that she started to have intermittent vaginal bleeding once she started the ASA. She states she will bleed ~ 3/7 days as week. She advised she was at the hairdresser today and felt as if she needed to urinate. She advised once she went to the restroom she had bleeding very similar to a menstrual period.   She states she has intermittently stopped the ASA over the last month and her bleeding will subside/ greatly improve.  I inquired if she has reached out to Dr. Karin Lieu office and she advised she has spoken with the nurse there. She was advised by nursing to continue ASA & Plavix until Dr. Karin Lieu could advise further. The patient states she told the nurse there that she could not continue ASA with the bleeding issue.  She confirms she had no bleeding issues on Plavix alone. I have advised the patient to continue Plavix and await feedback from Dr. Karin Lieu regarding resumption of her ASA.  I have also advised the patient if bleeding persist off of ASA she should follow up with her PCP for a possible GYN referral as she currently does not follow with GYN.  The patient voices understanding of the above recommendations and is agreeable.  She was very appreciative of the call back.

## 2021-08-09 NOTE — Telephone Encounter (Signed)
Patient is calling with concerns Vascular surgeon has her taking asprin along with her plavix 75 mg & now vagina bleeding, please advise

## 2021-08-22 ENCOUNTER — Inpatient Hospital Stay: Admit: 2021-08-22 | Payer: Medicare HMO | Admitting: Vascular Surgery

## 2021-08-22 SURGERY — TRANSCAROTID ARTERY REVASCULARIZATION (TCAR)
Anesthesia: General | Laterality: Left

## 2021-08-22 NOTE — Progress Notes (Deleted)
Cardiology Office Note    Date:  08/22/2021   ID:  Tara Adams, DOB [birth year], MRN [id number]  PCP:  [name], MD  Cardiologist:  Debbe Odea, MD  Electrophysiologist:  None   Chief Complaint: Risk stratification  History of Present Illness:   Tara Adams is a 70 y.o. female with history of CAD status post two-vessel CABG, symptomatic left-sided carotid artery disease, HTN, and HLD who presents for cardiac risk stratification for planned left TCAR***.  She underwent two-vessel CABG approximately 20 years ago at [facility].  After that, she was lost to cardiology follow-up at some point.  She established with [facility] in 03/2021 noting progressive exertional dyspnea and chest pain.  Given this, she underwent Lexiscan MPI on 04/12/2021 which was abnormal and demonstrated a large in size, severe, mostly reversible defect involving the inferior and inferolateral myocardium consistent with ischemia and possibly a small region of scar.  There was also a small in size, moderate to severe, reversible defect involving the mid and apical anterior segments consistent with ischemia.  LVEF estimated at 41%.  Attenuation corrected CT images demonstrated post CABG findings with coronary artery calcification and aortic atherosclerosis.  Patchy groundglass opacities were noted in both lungs with consideration for further evaluation with a dedicated chest CT noted.  Overall, this was a high risk scan.  Given these findings, LHC was recommended.  She underwent echo on 05/12/2021 which demonstrated an EF of 50 to 55%, no regional wall motion abnormalities, grade 1 diastolic dysfunction, normal RV systolic function and ventricular cavity size, mild mitral regurgitation, tricuspid aortic valve with mild to moderate sclerosis without evidence of stenosis, and an estimated right atrial pressure of 3 mmHg.  Carotid artery ultrasound, obtained due to report of right-sided  weakness several months prior, at that time demonstrated a 1 to 39% right ICA stenosis with 60 to 79% left ICA stenosis with less than 50% stenosis of the bilateral external carotid arteries, bilateral antegrade flow within the vertebral arteries, and normal flow hemodynamics within the bilateral subclavian arteries.  Further imaging via CTA head and neck was recommended.  She underwent LHC on 05/20/2021 which demonstrated severe native vessel CAD with CTO of the LAD, LCx, and RCA.  Patent LIMA to LAD that supplied collaterals to the RCA system was noted.  The SVG to LCx had 90% stenosis in the distal insertion site supplying a diffusely diseased LCx vessel that was overall a small territory.  The RCA was unbypassed, chronically occluded and supplied by left-to-right collaterals.  Medical management was recommended with the benefit of revascularizing the distal SVG graft being minimal given a supply territory, and was high risk for occlusion of the branch vessels.  She was last seen in the office on 07/15/2021 and was noted to have not tolerated higher dose of Inderal leading this to be decreased to 15 mg daily.  With the addition of Imdur, her shortness of breath and chest pain were much improved leading this to be titrated to 30 mg daily.  It was noted no further cardiac testing or intervention would mitigate the patient's cardiac risk in the perioperative timeframe and she could proceed with CEA from a cardiac perspective at that time.  She has subsequently established with vascular surgery for further management of her carotid artery disease with a CTA head and neck on 07/20/2021 demonstrating atherosclerotic disease at both carotid bifurcations with no stenosis on the right and 67% stenosis of the proximal left  ICA due to soft and calcified plaque.  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  The right vertebral artery was severely stenotic at its origin and nearly occluded.  Vascular surgery plans to proceed  with a left TCAR to lower her stroke risk, pending cardiac risk stratification.  She was also started on ASA 81 mg daily in addition to clopidogrel 75 mg daily.    In late 07/2021, she contacted our office noting she was having intermittent vaginal bleeding with the addition of ASA.  She was advised to contact vascular surgery.  She contacted their office and reported she decided not to take aspirin further, though would continue clopidogrel.  Given this bleeding, she has been advised to follow-up with GYN.  Vascular surgery plans to follow-up with her to discuss TCAR versus CEA.  This visit is scheduled for later this month.  She presents today for cardiac risk stratification prior to undergoing left TCAR.  ***   Revised Cardiac Risk Index: She is *** risk for noncardiac procedure with an estimated rate of adverse cardiac event in the perioperative timeframe of ***  [facility] Activity Status Index: She can achieve *** METs   Labs independently reviewed: 04/2021 - Hgb 11.7, PLT 293, BUN 27, serum creatinine 1.02, potassium 4.2 03/2021 - TC 219, TG 177, HDL 58, LDL 130  Past Medical History:  Diagnosis Date   Coronary artery disease    a. ~ 2002 s/p CABG x 2 ([facility]); b. 03/2021 MV: Inferior, inferolateral, mid and apical anterior ischemia. EF 41%. High risk study.   Hyperlipidemia    Hypertension    Morbid obesity (HCC)     Past Surgical History:  Procedure Laterality Date   CESAREAN SECTION     CORONARY ARTERY BYPASS GRAFT N/A    LEFT HEART CATH AND CORS/GRAFTS ANGIOGRAPHY N/A 05/20/2021   Procedure: LEFT HEART CATH AND CORS/GRAFTS ANGIOGRAPHY;  Surgeon: Iran Ouch, MD;  Location: [facility] INVASIVE CV LAB;  Service: Cardiovascular;  Laterality: N/A;    Current Medications: No outpatient medications have been marked as taking for the 08/26/21 encounter (Appointment) with Sondra Barges, PA-C.    Allergies:   Codeine   Social History   Socioeconomic History   Marital  status: Single    Spouse name: Not on file   Number of children: Not on file   Years of education: Not on file   Highest education level: Not on file  Occupational History   Not on file  Tobacco Use   Smoking status: Former    Types: Cigarettes    Quit date: 05/29/1981    Years since quitting: 40.2   Smokeless tobacco: Never  Vaping Use   Vaping Use: Never used  Substance and Sexual Activity   Alcohol use: Never   Drug use: Never   Sexual activity: Not on file  Other Topics Concern   Not on file  Social History Narrative   Lives in [place].  Volunteers, driving elderly individuals.  Does not routinely exercise.   Social Determinants of Health   Financial Resource Strain: Not on file  Food Insecurity: Not on file  Transportation Needs: Not on file  Physical Activity: Not on file  Stress: Not on file  Social Connections: Not on file     Family History:  The patient's family history is not on file.  ROS:   ROS   EKGs/Labs/Other Studies Reviewed:    Studies reviewed were summarized above. The additional studies were reviewed today:  LHC  05/20/2021:   Prox RCA to Mid RCA lesion is 100% stenosed.   Ost Cx to Prox Cx lesion is 100% stenosed.   Prox LAD to Mid LAD lesion is 100% stenosed.   Dist SVG to LCx to Insertion lesion is 95% stenosed.   Dist LAD lesion is 80% stenosed.   Conclusion: Severe native three-vessel coronary artery disease with chronic total occlusion of the LAD, left circumflex, and RCA. Patent LIMA to LAD that supplies collaterals to the RCA system. SVG to left circumflex has a 90% stenosis at the distal insertion site supplying a diffusely diseased circumflex vessel. Overall, small territory. The RCA is unbypassed and chronically occluded supplied by left-to-right collaterals.   Recommendations: Medical management of angina.  The benefit of revascularizing the distal SVG graft is minimal and is high risk for occlusion of the branch  vessels. Aspirin 81 mg indefinitely. Aggressive secondary prevention. __________  2D echo 05/12/2021: 1. Left ventricular ejection fraction, by estimation, is 50 to 55%. The  left ventricle has low normal function. The left ventricle has no regional  wall motion abnormalities. Left ventricular diastolic parameters are  consistent with Grade I diastolic  dysfunction (impaired relaxation).   2. Right ventricular systolic function is normal. The right ventricular  size is normal.   3. The mitral valve is normal in structure. Mild mitral valve  regurgitation.   4. The aortic valve is tricuspid. Aortic valve regurgitation is not  visualized. Mild to moderate aortic valve sclerosis/calcification is  present, without any evidence of aortic stenosis.   5. The inferior vena cava is normal in size with greater than 50%  respiratory variability, suggesting right atrial pressure of 3 mmHg. __________  Carotid artery ultrasound 05/12/2021: Summary:  Right Carotid: Velocities in the right ICA are consistent with a 1-39%  stenosis. Non-hemodynamically significant plaque <50% noted in the  CCA. The ECA appears <50% stenosed.   Left Carotid: Velocities in the left ICA are consistent with a 60-79%  stenosis. Non-hemodynamically significant plaque <50% noted in the  CCA. The ECA appears <50% stenosed.   Vertebrals:  Bilateral vertebral arteries demonstrate antegrade flow.  Subclavians: Normal flow hemodynamics were seen in bilateral subclavian arteries.   *See table(s) above for measurements and observations.  Suggest follow up study in 12 months. __________  Eugenie Birks MPI 04/11/2021: Abnormal pharmacologic myocardial perfusion stress test. There is a large in size, severe, mostly reversible defect involving the inferior and inferolateral myocardial consistent with ischemia and possibly a small region of scar. There is a small in size, moderate in severe, reversible defect involving the mid and  apical anterior segments consistent with ischemia. The left ventricular ejection fraction is moderately decreased (41%) but appears slightly better by visual estimation. Echocardiographic correlation is recommended. Attenuation correction CT demonstrates post-CABG findings, coronary artery calcification and aortic atherosclerosis. Patchy and groudglass opacities are noted in both lungs. Consider further evaluation with dedicated chest CT. This is a high risk study.    EKG:  EKG is ordered today.  The EKG ordered today demonstrates ***  Recent Labs: 05/12/2021: BUN 27; Hemoglobin 11.7; Platelets 293; Potassium 4.2; Sodium 146 07/19/2021: Creatinine, Ser 1.10  Recent Lipid Panel    Component Value Date/Time   CHOL 219 (H) 03/28/2021 1501   TRIG 177 (H) 03/28/2021 1501   HDL 58 03/28/2021 1501   CHOLHDL 3.8 03/28/2021 1501   LDLCALC 130 (H) 03/28/2021 1501    PHYSICAL EXAM:    VS:  There were no vitals taken for this  visit.  BMI: There is no height or weight on file to calculate BMI.  Physical Exam  Wt Readings from Last 3 Encounters:  07/25/21 214 lb (97.1 kg)  07/15/21 215 lb (97.5 kg)  07/04/21 213 lb (96.6 kg)     ASSESSMENT & PLAN:   Preoperative cardiac risk stratification: Patient is planning to undergo a left TCAR given symptomatic left-sided internal carotid artery stenosis to reduce stroke risk.  Per Revised Cardiac Risk Rndex, she is *** risk for noncardiac surgery with an estimated rate of adverse cardiac event at *** in the perioperative timeframe.  Per [facility] Activity Status Index, she can achieve *** METs.  She has previously been evaluated for this on 07/15/2021 with primary cardiologist noting no additional cardiac testing or intervention would mitigate the patient's risk.  I continue to agree with this statement***.  CAD status post CABG without***angina:  HTN: Blood pressure ***  HLD: LDL  Symptomatic left internal carotid artery stenosis:  Morbid obesity  with sleep disordered breathing:   {Are you ordering a CV Procedure (e.g. stress test, cath, DCCV, TEE, etc)?   Press F2        :[id number]}     Disposition: F/u with Dr. Azucena Cecil or an APP in ***.   Medication Adjustments/Labs and Tests Ordered: Current medicines are reviewed at length with the patient today.  Concerns regarding medicines are outlined above. Medication changes, Labs and Tests ordered today are summarized above and listed in the Patient Instructions accessible in Encounters.   Signed, Eula Listen, PA-C 08/22/2021 4:09 PM     [facility] [place] [address] [place], [place] [address] [phone]

## 2021-08-26 ENCOUNTER — Ambulatory Visit: Payer: Medicare HMO | Admitting: Physician Assistant

## 2021-08-31 ENCOUNTER — Other Ambulatory Visit: Payer: Self-pay | Admitting: Nurse Practitioner

## 2021-09-02 ENCOUNTER — Ambulatory Visit: Payer: Medicare HMO | Admitting: Vascular Surgery

## 2021-09-02 ENCOUNTER — Encounter: Payer: Self-pay | Admitting: Vascular Surgery

## 2021-09-02 ENCOUNTER — Telehealth: Payer: Self-pay | Admitting: Cardiology

## 2021-09-02 ENCOUNTER — Other Ambulatory Visit: Payer: Self-pay

## 2021-09-02 VITALS — BP 139/80 | HR 58 | Temp 98.2°F | Resp 20 | Ht 65.0 in | Wt 217.0 lb

## 2021-09-02 DIAGNOSIS — I6523 Occlusion and stenosis of bilateral carotid arteries: Secondary | ICD-10-CM | POA: Diagnosis not present

## 2021-09-02 NOTE — Telephone Encounter (Signed)
° ° °  Patient Name: Tara Adams  DOB: [birth year] MRN: [id number]  Primary Cardiologist: Debbe Odea, MD  Chart reviewed as part of pre-operative protocol coverage. Given past medical history and time since last visit, based on ACC/AHA guidelines, [name] would be at acceptable risk for the planned procedure without further cardiovascular testing.   Patient was previously cleared for the same procedure, however started having vaginal bleeding after aspirin was added to her Plavix therapy.  She was instructed to discussed the case with Dr. Zella Ball her vascular surgeon who removed Plavix and left her on aspirin monotherapy.  She says since then, her vaginal bleeding has completely resolved since 1.5 weeks ago.  She denies any recent exertional chest pain or worsening dyspnea.  Previous cardiac catheterization in September 2022 did not reveal any interventional target, medical therapy was recommended at the time.  The patient was advised that if she develops new symptoms prior to surgery to contact our office to arrange for a follow-up visit, and she verbalized understanding.  I will route this recommendation to the requesting party via Epic fax function and remove from pre-op pool.  Please call with questions.  [place], [place] 09/02/2021, 4:17 PM

## 2021-09-02 NOTE — Progress Notes (Signed)
Office Note     CC: Bilateral carotid artery stenosis Requesting Provider:  [name], MD  HPI: Tara Adams is a 70 y.o. ([birth year]) female presenting in follow-up for symptomatic left-sided internal carotid artery stenosis.  Patient was scheduled for transcarotid artery revascularization, however noted vaginal bleeding and therefore discontinued Plavix.  We discussed carotid endarterectomy as an alternative over the phone, however Venise felt more comfortable discussing surgery further in clinic.  She presents today for surgical discussion.  Since last seen, [name] has been doing well.  Her vaginal bleeding subsided with the discontinuation of Plavix.  She has had no new events cerebrovascular events.  She denies stroke, TIA, amaurosis. She has no symptoms of claudication, rest pain, lower extremity tissue loss.  She has been cleared from a cardiac perspective.  The pt is on a statin for cholesterol management.  The pt is on a daily aspirin.   Other AC:  plavix The pt is on on medications for hypertension.   The pt is not diabetic.  Tobacco hx:  remote  Past Medical History:  Diagnosis Date   Carotid artery occlusion    Coronary artery disease    a. ~ 2002 s/p CABG x 2 ([facility]); b. 03/2021 MV: Inferior, inferolateral, mid and apical anterior ischemia. EF 41%. High risk study.   Hyperlipidemia    Hypertension    Morbid obesity (HCC)     Past Surgical History:  Procedure Laterality Date   CESAREAN SECTION     CORONARY ARTERY BYPASS GRAFT N/A    LEFT HEART CATH AND CORS/GRAFTS ANGIOGRAPHY N/A 05/20/2021   Procedure: LEFT HEART CATH AND CORS/GRAFTS ANGIOGRAPHY;  Surgeon: Iran Ouch, MD;  Location: [facility] INVASIVE CV LAB;  Service: Cardiovascular;  Laterality: N/A;    Social History   Socioeconomic History   Marital status: Single    Spouse name: Not on file   Number of children: Not on file   Years of education: Not on file   Highest education  level: Not on file  Occupational History   Not on file  Tobacco Use   Smoking status: Former    Types: Cigarettes    Quit date: 05/29/1981    Years since quitting: 40.2   Smokeless tobacco: Never  Vaping Use   Vaping Use: Never used  Substance and Sexual Activity   Alcohol use: Never   Drug use: Never   Sexual activity: Not on file  Other Topics Concern   Not on file  Social History Narrative   Lives in [place].  Volunteers, driving elderly individuals.  Does not routinely exercise.   Social Determinants of Health   Financial Resource Strain: Not on file  Food Insecurity: Not on file  Transportation Needs: Not on file  Physical Activity: Not on file  Stress: Not on file  Social Connections: Not on file  Intimate Partner Violence: Not on file   History reviewed. No pertinent family history.  Current Outpatient Medications  Medication Sig Dispense Refill   Ascorbic Acid (VITAMIN C) 1000 MG tablet Take 1,000 mg by mouth daily.     aspirin EC 81 MG tablet Take 81 mg by mouth daily. Swallow whole.     atorvastatin (LIPITOR) 80 MG tablet Take 1 tablet (80 mg total) by mouth daily. 90 tablet 3   butalbital-acetaminophen-caffeine (FIORICET) 50-325-40 MG tablet Take 1-2 tablets by mouth every 4 (four) hours as needed for headache.     Cholecalciferol (VITAMIN D) 50 MCG (2000 UT) CAPS Take  2,000 Units by mouth daily.     gabapentin (NEURONTIN) 100 MG capsule Take 100 mg by mouth at bedtime.     hydrALAZINE (APRESOLINE) 50 MG tablet TAKE 1 TABLET BY MOUTH THREE TIMES DAILY 270 tablet 0   hydrocortisone cream 0.5 % Apply 1 application topically as needed for itching (Eczema).     ibuprofen (ADVIL) 200 MG tablet Take 400-800 mg by mouth every 6 (six) hours as needed for mild pain.     isosorbide mononitrate (IMDUR) 30 MG 24 hr tablet Take 1 tablet (30 mg total) by mouth daily. 90 tablet 1   metoprolol tartrate (LOPRESSOR) 100 MG tablet Take 100 mg by mouth 2 (two) times daily.      Multiple Vitamin (MULTIVITAMIN WITH MINERALS) TABS tablet Take 1 tablet by mouth daily.     nitroGLYCERIN (NITROSTAT) 0.4 MG SL tablet Place 0.4 mg under the tongue every 5 (five) minutes as needed for chest pain.     tiZANidine (ZANAFLEX) 4 MG tablet Take 4 mg by mouth every 8 (eight) hours as needed for muscle spasms.     triamterene-hydrochlorothiazide (MAXZIDE) 75-50 MG tablet Take 1 tablet by mouth daily.     vitamin B-12 (CYANOCOBALAMIN) 1000 MCG tablet Take 1,000 mcg by mouth daily.     clopidogrel (PLAVIX) 75 MG tablet Take 1 tablet (75 mg total) by mouth daily. (Patient not taking: Reported on 09/02/2021) 30 tablet 5   No current facility-administered medications for this visit.    Allergies  Allergen Reactions   Codeine Itching     REVIEW OF SYSTEMS:   [X]  denotes positive finding, [ ]  denotes negative finding Cardiac  Comments:  Chest pain or chest pressure:    Shortness of breath upon exertion:    Short of breath when lying flat:    Irregular heart rhythm:        Vascular    Pain in calf, thigh, or hip brought on by ambulation:    Pain in feet at night that wakes you up from your sleep:     Blood clot in your veins:    Leg swelling:         Pulmonary    Oxygen at home:    Productive cough:     Wheezing:         Neurologic    Sudden weakness in arms or legs:     Sudden numbness in arms or legs:     Sudden onset of difficulty speaking or slurred speech:    Temporary loss of vision in one eye:     Problems with dizziness:         Gastrointestinal    Blood in stool:     Vomited blood:         Genitourinary    Burning when urinating:     Blood in urine:        Psychiatric    Major depression:         Hematologic    Bleeding problems:    Problems with blood clotting too easily:        Skin    Rashes or ulcers:        Constitutional    Fever or chills:      PHYSICAL EXAMINATION:  Vitals:   09/02/21 1214  BP: 139/80  Pulse: (!) 58  Resp: 20   Temp: 98.2 F (36.8 C)  SpO2: 93%  Weight: 217 lb (98.4 kg)  Height: 5\' 5"  (1.651 m)  General:  WDWN in NAD; vital signs documented above Gait: Not observed HENT: WNL, normocephalic Pulmonary: normal non-labored breathing , without Rales, rhonchi,  wheezing Cardiac: regular HR,  Abdomen: soft, NT, no masses Skin: without rashes Vascular Exam/Pulses:  Right Left  Radial 2+ (normal) 2+ (normal)  Ulnar 2+ (normal) 2+ (normal)  Femoral    Popliteal    DP 2+ (normal) 2+ (normal)       Extremities: without ischemic changes, without Gangrene , without cellulitis; without open wounds;  Musculoskeletal: no muscle wasting or atrophy  Neurologic: A&O X 3;  No focal weakness or paresthesias are detected Psychiatric:  The pt has Normal affect.   Non-Invasive Vascular Imaging:   Summary:  Right Carotid: Velocities in the right ICA are consistent with a 1-39%  stenosis.                 Non-hemodynamically significant plaque <50% noted in the  CCA. The                 ECA appears <50% stenosed.   Left Carotid: Velocities in the left ICA are consistent with a 60-79%  stenosis.                Non-hemodynamically significant plaque <50% noted in the  CCA. The                ECA appears <50% stenosed.   Vertebrals:  Bilateral vertebral arteries demonstrate antegrade flow.  Subclavians: Normal flow hemodynamics were seen in bilateral subclavian               arteries.   Cardiac Cath -   Prox RCA to Mid RCA lesion is 100% stenosed.   Ost Cx to Prox Cx lesion is 100% stenosed.   Prox LAD to Mid LAD lesion is 100% stenosed.   Dist SVG to LCx to Insertion lesion is 95% stenosed.   Dist LAD lesion is 80% stenosed.   Conclusion: Severe native three-vessel coronary artery disease with chronic total occlusion of the LAD, left circumflex, and RCA. Patent LIMA to LAD that supplies collaterals to the RCA system. SVG to left circumflex has a 90% stenosis at the distal insertion site  supplying a diffusely diseased circumflex vessel. Overall, small territory. The RCA is unbypassed and chronically occluded supplied by left-to-right collaterals.   Recommendations: Medical management of angina.  The benefit of revascularizing the distal SVG graft is minimal and is high risk for occlusion of the branch vessels. Aspirin 81 mg indefinitely. Aggressive secondary prevention.  Most Recent: IMPRESSION: Atherosclerotic disease at both carotid bifurcations. No stenosis on the right. 67% stenosis of the proximal ICA on the left due to soft and calcified plaque.   Atherosclerotic calcification in both carotid siphon regions but without stenosis greater than 30%.   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 Beyond that, the right vertebral artery is patent to the basilar. The basilar artery shows serial 30% stenoses. Posterior circulation branch vessels do show flow. There are patent bilateral posterior communicating arteries, significantly protective in this situation.    ASSESSMENT/PLAN:: 70 y.o. female presenting with history of symptomatic left internal carotid artery stenosis.  Patient could not tolerate dual antiplatelet therapy due to vaginal bleeding, and is therefore not a candidate for TCAR.  I discussed carotid endarterectomy with Nohelia.  After discussing the risk and benefits, she elected to proceed.  Darleen asked to wait until the first of the  year.  We discussed the risk associated with symptomatic carotid artery stenosis.  I told her I could not guarantee she would not have a stroke in the interim.  Being that there is a 6% decrease in stroke rate in patients with 50 to 69% stenosis at 2 years with intervention versus medical management, I believe the odds of stroke in the coming month remain relatively low, but or not 0  We will plan for left-sided carotid endarterectomy. He was asked to  continue aspirin, statin. She was asked to seek immediate medical attention should new TIA, stroke, amaurosis occur.   Victorino Sparrow, MD [facility] [phone]

## 2021-09-02 NOTE — Telephone Encounter (Signed)
° °  Pre-operative Risk Assessment    Patient Name: Tara Adams  DOB: [birth year] MRN: [id number]      Request for Surgical Clearance    Procedure:   L carotid endarterectomy   Date of Surgery:  Clearance TBD                                 Surgeon:  Dr Comer Locket Group or Practice Name:  [facility]  Phone number:  [phone] Fax number:  [phone]   Type of Clearance Requested:   - Medical    Type of Anesthesia:  Not Indicated   Additional requests/questions:    SignedNorman Herrlich   09/02/2021, 3:16 PM

## 2021-09-02 NOTE — Telephone Encounter (Signed)
Dr. Azucena Cecil. Patient wish to make sure aware that she had vaginal bleeding after her vascular surgeon added aspirin to the plavix, since then, she has discontinued plavix at the recommendation of her vascular surgeon and stayed on aspirin monotherapy. Since then, her vaginal bleeding has completely stopped about 1.5 weeks ago.   Also she has appt scheduled to see Eula Listen on 1/17 and appt to see you on 1/30, she is not sure why she needed the 2 cardiology appt and wondering if she needs the 1/17 appt.   She was cleared for the vascular surgery in November, since her overall cardiac condition has not changed, I went ahead and cleared her again for the same procedure.

## 2021-09-05 NOTE — Telephone Encounter (Signed)
Patient returning call States she received the message and is aware that 01/17 is cancelled and she will see Dr Azucena Cecil on 01/30

## 2021-09-05 NOTE — Telephone Encounter (Signed)
Called and left a VM requesting a call back so I can inform the patient that I cancelled her appointment on 10/04/20.  Debbe Odea, MD  You; [name], [place] 6 minutes ago (9:28 AM)   Cancel follow-up appointment with APP on 1/17.  Keep follow-up appointment with myself as scheduled.   Thank you

## 2021-09-06 ENCOUNTER — Other Ambulatory Visit: Payer: Self-pay

## 2021-09-06 DIAGNOSIS — I6523 Occlusion and stenosis of bilateral carotid arteries: Secondary | ICD-10-CM

## 2021-09-23 NOTE — Progress Notes (Signed)
Surgical Instructions   Your procedure is scheduled on Thursday 09/29/2021.  Report to [facility] Main Entrance "A" at 05:30 A.M., then check in with the Admitting office.  Call [phone] if you have problems or questions between now and the morning of surgery:   Remember: Do not eat or drink after midnight the night before your surgery    Take these medicines the morning of surgery with A SIP OF WATER:  Aspirin EC Atorvastatin (Lipitor) Hydralazine (Apresoline) Isosorbide-mononitrate (Imdur) Metoprolol tartrate (Lopressor)    If needed you may take these medications the morning of surgery: Gabapentin (Neurontin) Nitroglycerin (Nitrostat) Tizanidine (Zanaflex)    As of today, STOP taking any Meloxicam (Mobic), Aspirin (unless otherwise instructed by your surgeon) or Aspirin-containing products; NSAIDS - Aleve, Naproxen, Ibuprofen, Motrin, Advil, Goody's, BC's, all herbal medications, fish oil, and all vitamins.   After your pre-procedure COVID test  You are not required to quarantine however you are required to wear a well-fitting mask when you are out and around people not in your household.  If your mask becomes wet or soiled, replace with a new one.  Wash your hands often with soap and water for 20 seconds or clean your hands with an alcohol-based hand sanitizer that contains at least 60% alcohol.  Do not share personal items.  Notify your provider: if you are in close contact with someone who has COVID  or if you develop a fever of 100.4 or greater, sneezing, cough, sore throat, shortness of breath or body aches.           Do not wear jewelry or makeup  Do not wear lotions, powders, perfumes, or deodorant.  Do not shave 48 hours prior to surgery.  Do not wear nail polish, gel polish, artificial nails, or any other type of covering on natural nails including fingernails and toenails. If patients have artificial nails, gel coating, etc. that need to be removed  by a nail salon please have this removed prior to surgery or surgery may need to be canceled/delayed if the surgeon/ anesthesia feels like the patient is unable to be adequately monitored.  Do not bring valuables to the hospital - [facility] is not responsible for any belongings or valuables.  Do NOT Smoke (Tobacco/Vaping) or drink Alcohol 24 hours prior to your procedure  If you use a CPAP at night, please bring your mask for your overnight stay.   Contacts, glasses, hearing aids, dentures or partials may not be worn into surgery, please bring cases for these belongings   For patients admitted to the hospital, discharge time will be determined by your treatment team.   Patients discharged the day of surgery will not be allowed to drive home, and someone needs to stay with them for 24 hours.  NO VISITORS WILL BE ALLOWED IN PRE-OP WHERE PATIENTS ARE PREPPED FOR SURGERY.  ONLY 1 SUPPORT PERSON MAY BE PRESENT IN THE WAITING ROOM WHILE YOU ARE IN SURGERY.  IF YOU ARE TO BE ADMITTED, ONCE YOU ARE IN YOUR ROOM YOU WILL BE ALLOWED TWO (2) VISITORS. 1 (ONE) VISITOR MAY STAY OVERNIGHT BUT MUST ARRIVE TO THE ROOM BY 8pm.  Minor children may have two parents present. Special consideration for safety and communication needs will be reviewed on a case by case basis.  Special instructions:    Oral Hygiene is also important to reduce your risk of infection.  Remember - BRUSH YOUR TEETH THE MORNING OF SURGERY WITH YOUR REGULAR TOOTHPASTE   [facility]-  Preparing For Surgery  Before surgery, you can play an important role. Because skin is not sterile, your skin needs to be as free of germs as possible. You can reduce the number of germs on your skin by washing with CHG (chlorahexidine gluconate) Soap before surgery.  CHG is an antiseptic cleaner which kills germs and bonds with the skin to continue killing germs even after washing.     Please do not use if you have an allergy to CHG or antibacterial soaps.  If your skin becomes reddened/irritated stop using the CHG.  Do not shave (including legs and underarms) for at least 48 hours prior to first CHG shower. It is OK to shave your face.  Please follow these instructions carefully.     Shower the NIGHT BEFORE SURGERY and the MORNING OF SURGERY with CHG Soap.   If you chose to wash your hair, wash your hair first as usual with your normal shampoo. After you shampoo, rinse your hair and body thoroughly to remove the shampoo.    Then [employer] and genitals (private parts) with your normal soap and rinse thoroughly to remove soap.  Next use the CHG Soap as you would any other liquid soap. You can apply CHG directly to the skin and wash gently with a clean washcloth.   Apply the CHG Soap to your body ONLY FROM THE NECK DOWN.  Do not use on open wounds or open sores. Avoid contact with your eyes, ears, mouth and genitals (private parts). Wash Face and genitals (private parts)  with your normal soap.   Wash thoroughly, paying special attention to the area where your surgery will be performed.  Thoroughly rinse your body with warm water from the neck down.  DO NOT shower/wash with your normal soap after using and rinsing off the CHG Soap.  Pat yourself dry with a CLEAN TOWEL.  Wear CLEAN PAJAMAS to bed the night before surgery  Place CLEAN SHEETS on your bed the night before your surgery  DO NOT SLEEP WITH PETS.   Day of Surgery:  Take a shower with CHG soap. Wear Clean/Comfortable clothing the morning of surgery Do not apply any deodorants/lotions.   Remember to brush your teeth WITH YOUR REGULAR TOOTHPASTE.   Please read over the fact sheets that you were given.

## 2021-09-26 ENCOUNTER — Inpatient Hospital Stay (HOSPITAL_COMMUNITY)
Admission: RE | Admit: 2021-09-26 | Discharge: 2021-09-26 | Disposition: A | Discharge status: Home / Self Care | Payer: Medicare HMO | Source: Ambulatory Visit

## 2021-09-29 ENCOUNTER — Inpatient Hospital Stay (HOSPITAL_COMMUNITY): Admission: RE | Admit: 2021-09-29 | Payer: Medicare HMO | Source: Home / Self Care | Admitting: Vascular Surgery

## 2021-09-29 ENCOUNTER — Encounter (HOSPITAL_COMMUNITY): Admission: RE | Payer: Self-pay | Source: Home / Self Care

## 2021-09-29 SURGERY — TRANSCAROTID ARTERY REVASCULARIZATION (TCAR)
Anesthesia: General | Laterality: Left

## 2021-10-04 ENCOUNTER — Ambulatory Visit: Payer: Medicare HMO | Admitting: Physician Assistant

## 2021-10-17 ENCOUNTER — Encounter: Payer: Self-pay | Admitting: Cardiology

## 2021-10-17 ENCOUNTER — Other Ambulatory Visit: Payer: Self-pay

## 2021-10-17 ENCOUNTER — Ambulatory Visit (INDEPENDENT_AMBULATORY_CARE_PROVIDER_SITE_OTHER): Payer: Medicare HMO | Admitting: Cardiology

## 2021-10-17 VITALS — BP 160/82 | HR 56 | Ht 64.0 in | Wt 216.0 lb

## 2021-10-17 DIAGNOSIS — I10 Essential (primary) hypertension: Secondary | ICD-10-CM

## 2021-10-17 DIAGNOSIS — E7800 Pure hypercholesterolemia, unspecified: Secondary | ICD-10-CM | POA: Diagnosis not present

## 2021-10-17 DIAGNOSIS — I2510 Atherosclerotic heart disease of native coronary artery without angina pectoris: Secondary | ICD-10-CM | POA: Diagnosis not present

## 2021-10-17 MED ORDER — CARVEDILOL 25 MG PO TABS: 25.0000 mg | ORAL_TABLET | Freq: Two times a day (BID) | ORAL | 0 refills | Status: DC

## 2021-10-17 MED ORDER — ISOSORBIDE MONONITRATE ER 60 MG PO TB24: 60.0000 mg | ORAL_TABLET | Freq: Every day | ORAL | 0 refills | Status: DC

## 2021-10-17 MED ORDER — AMLODIPINE BESYLATE 10 MG PO TABS: 10.0000 mg | ORAL_TABLET | Freq: Every day | ORAL | 0 refills | Status: DC

## 2021-10-17 NOTE — Patient Instructions (Addendum)
Medication Instructions:  DISCONTINUE aspirin, lopressor (metoprolol), and hydralazine START carvedilol (Coreg) 25mg  by mouth twice daily. START amlodipine (norvasc) 10mg  by mouth daily. INCREASE isosorbide (Imdur) to 60mg  by mouth daily.  *If you need a refill on your cardiac medications before your next appointment, please call your pharmacy*  Lab Work: None ordered  If you have labs (blood work) drawn today and your tests are completely normal, you will receive your results only by: MyChart Message (if you have MyChart) OR A paper copy in the mail If you have any lab test that is abnormal or we need to change your treatment, we will call you to review the results.  Testing/Procedures: None ordered  Follow-Up: At [facility], you and your health needs are our priority.  As part of our continuing mission to provide you with exceptional heart care, we have created designated Provider Care Teams.  These Care Teams include your primary Cardiologist (physician) and Advanced Practice Providers (APPs -  Physician Assistants and Nurse Practitioners) who all work together to provide you with the care you need, when you need it.  We recommend signing up for the patient portal called "MyChart".  Sign up information is provided on this After Visit Summary.  MyChart is used to connect with patients for Virtual Visits (Telemedicine).  Patients are able to view lab/test results, encounter notes, upcoming appointments, etc.  Non-urgent messages can be sent to your provider as well.   To learn more about what you can do with MyChart, go to .    Your next appointment:   6 week(s)  The format for your next appointment:   In Person  Provider:   , MD

## 2021-10-17 NOTE — Progress Notes (Signed)
Cardiology Office Note:    Date:  10/17/2021   ID:  Retal Harlan, DOB [birth year], MRN [id number]  PCP:  Marguerita Merles, MD   [facility] Providers Cardiologist:  Kate Sable, MD     Referring MD: Marguerita Merles, MD   No chief complaint on file.   History of Present Illness:    Tara Adams is a 71 y.o. female with a hx of CAD/CABG x 2 (2002), hypertension, hyperlipidemia, left carotid artery stenosis( 60-79%) who presents for follow-up.    Being seen for CAD.  Symptoms of chest pain have significantly improved since starting Imdur.  Developed vaginal bleeding while on Plavix and aspirin.  Plavix was stopped with resolution of bleeding.  She previously took Plavix only with no bleeding issues.  Being evaluated by vascular surgery for carotid stenosis, patient seeking second opinion.  Checks BP frequently at home, systolic usually in the 0000000 to 150s.   Prior notes Echo 05/09/2021 EF 50 to 55%, impaired relaxation.  Mild MR. Left heart cath 05/2021 - significant three-vessel native disease (CTO LAD, Lcx, RCA).  Patent LIMA to LAD, SVG to left circumflex 90% stenosis at the distal insertion site.  Medical management recommended.  Past Medical History:  Diagnosis Date   Carotid artery occlusion    Coronary artery disease    a. ~ 2002 s/p CABG x 2 ([facility]); b. 03/2021 MV: Inferior, inferolateral, mid and apical anterior ischemia. EF 41%. High risk study.   Hyperlipidemia    Hypertension    Morbid obesity ([place])     Past Surgical History:  Procedure Laterality Date   CESAREAN SECTION     CORONARY ARTERY BYPASS GRAFT N/A    LEFT HEART CATH AND CORS/GRAFTS ANGIOGRAPHY N/A 05/20/2021   Procedure: LEFT HEART CATH AND CORS/GRAFTS ANGIOGRAPHY;  Surgeon: Wellington Hampshire, MD;  Location: [facility];  Service: Cardiovascular;  Laterality: N/A;    Current Medications: Current Meds  Medication Sig   amLODipine (NORVASC) 10 MG tablet Take 1 tablet (10  mg total) by mouth daily.   Ascorbic Acid (VITAMIN C) 1000 MG tablet Take 1,000 mg by mouth daily.   atorvastatin (LIPITOR) 80 MG tablet Take 1 tablet (80 mg total) by mouth daily.   butalbital-acetaminophen-caffeine (FIORICET) 50-325-40 MG tablet Take 1-2 tablets by mouth every 4 (four) hours as needed for headache.   CALCIUM-VITAMIN D PO Take 1 tablet by mouth daily.   carvedilol (COREG) 25 MG tablet Take 1 tablet (25 mg total) by mouth 2 (two) times daily.   clopidogrel (PLAVIX) 75 MG tablet Take 1 tablet (75 mg total) by mouth daily.   gabapentin (NEURONTIN) 100 MG capsule Take 100 mg by mouth 3 (three) times daily as needed (pain).   hydrALAZINE (APRESOLINE) 50 MG tablet TAKE 1 TABLET BY MOUTH THREE TIMES DAILY   hydrocortisone cream 0.5 % Apply 1 application topically as needed for itching (Eczema).   ibuprofen (ADVIL) 200 MG tablet Take 400-800 mg by mouth every 6 (six) hours as needed for mild pain.   isosorbide mononitrate (IMDUR) 60 MG 24 hr tablet Take 1 tablet (60 mg total) by mouth daily.   meloxicam (MOBIC) 15 MG tablet Take 15 mg by mouth daily as needed for pain.   Multiple Vitamin (MULTIVITAMIN WITH MINERALS) TABS tablet Take 1 tablet by mouth daily.   Multiple Vitamins-Minerals (HAIR/SKIN/NAILS/BIOTIN PO) Take 1 tablet by mouth daily.   nitroGLYCERIN (NITROSTAT) 0.4 MG SL tablet Place 0.4 mg under the tongue every  5 (five) minutes as needed for chest pain.   tiZANidine (ZANAFLEX) 4 MG tablet Take 4 mg by mouth every 8 (eight) hours as needed for muscle spasms.   triamterene-hydrochlorothiazide (MAXZIDE) 75-50 MG tablet Take 1 tablet by mouth daily.   vitamin B-12 (CYANOCOBALAMIN) 1000 MCG tablet Take 1,000 mcg by mouth daily.   [DISCONTINUED] aspirin EC 81 MG tablet Take 81 mg by mouth daily. Swallow whole.   [DISCONTINUED] isosorbide mononitrate (IMDUR) 30 MG 24 hr tablet Take 1 tablet (30 mg total) by mouth daily.   [DISCONTINUED] metoprolol tartrate (LOPRESSOR) 100 MG tablet  Take 100 mg by mouth 2 (two) times daily.     Allergies:   Codeine   Social History   Socioeconomic History   Marital status: Single    Spouse name: Not on file   Number of children: Not on file   Years of education: Not on file   Highest education level: Not on file  Occupational History   Not on file  Tobacco Use   Smoking status: Former    Types: Cigarettes    Quit date: 05/29/1981    Years since quitting: 40.4   Smokeless tobacco: Never  Vaping Use   Vaping Use: Never used  Substance and Sexual Activity   Alcohol use: Never   Drug use: Never   Sexual activity: Not on file  Other Topics Concern   Not on file  Social History Narrative   Lives in [place].  Volunteers, driving elderly individuals.  Does not routinely exercise.   Social Determinants of Health   Financial Resource Strain: Not on file  Food Insecurity: Not on file  Transportation Needs: Not on file  Physical Activity: Not on file  Stress: Not on file  Social Connections: Not on file     Family History: The patient's family history is not on file.  ROS:   Please see the history of present illness.     All other systems reviewed and are negative.  EKGs/Labs/Other Studies Reviewed:    The following studies were reviewed today:   EKG:  EKG not ordered today.    Recent Labs: 05/12/2021: BUN 27; Hemoglobin 11.7; Platelets 293; Potassium 4.2; Sodium 146 07/19/2021: Creatinine, Ser 1.10  Recent Lipid Panel    Component Value Date/Time   CHOL 219 (H) 03/28/2021 1501   TRIG 177 (H) 03/28/2021 1501   HDL 58 03/28/2021 1501   CHOLHDL 3.8 03/28/2021 1501   LDLCALC 130 (H) 03/28/2021 1501     Risk Assessment/Calculations:     Physical Exam:    VS:  BP (!) 160/82    Pulse (!) 56    Ht 5\' 4"  (1.626 m)    Wt 216 lb (98 kg)    SpO2 98%    BMI 37.08 kg/m     Wt Readings from Last 3 Encounters:  10/17/21 216 lb (98 kg)  09/02/21 217 lb (98.4 kg)  07/25/21 214 lb (97.1 kg)     GEN:  Well  nourished, well developed in no acute distress HEENT: Normal NECK: No JVD; No carotid bruits LYMPHATICS: No lymphadenopathy CARDIAC: RRR, 1/6 systolic murmurs, rubs, gallops RESPIRATORY:  Clear to auscultation without rales, wheezing or rhonchi  ABDOMEN: Soft, non-tender, non-distended MUSCULOSKELETAL:  No edema; No deformity  SKIN: Warm and dry NEUROLOGIC:  Alert and oriented x 3 PSYCHIATRIC:  Normal affect   ASSESSMENT:    1. Coronary artery disease involving native heart, unspecified vessel or lesion type, unspecified whether angina present  2. Primary hypertension   3. Pure hypercholesterolemia     PLAN:    In order of problems listed above:  History of CAD/CABG x2 (lima to LAD, SVG to OM in 2002).  [place] 2022 with patent lima to lad, 90%svg to OM.  Medical management advised.  Anginal symptoms improved with Imdur.  Increase Imdur to 60 mg daily.   Echo with EF 50-55%.  Continue aspirin, Lipitor Hypertension, BP elevated.  Stop hydralazine, stop Lopressor.  Start Coreg 25 mg twice daily, start amlodipine 10 mg daily, increase Imdur to 60 mg daily.  Continue HCTZ. Hyperlipidemia, continue Lipitor 80 mg daily.  Follow-up in 6 weeks.    Medication Adjustments/Labs and Tests Ordered: Current medicines are reviewed at length with the patient today.  Concerns regarding medicines are outlined above.  No orders of the defined types were placed in this encounter.     Meds ordered this encounter  Medications   isosorbide mononitrate (IMDUR) 60 MG 24 hr tablet    Sig: Take 1 tablet (60 mg total) by mouth daily.    Dispense:  90 tablet    Refill:  0   carvedilol (COREG) 25 MG tablet    Sig: Take 1 tablet (25 mg total) by mouth 2 (two) times daily.    Dispense:  180 tablet    Refill:  0   amLODipine (NORVASC) 10 MG tablet    Sig: Take 1 tablet (10 mg total) by mouth daily.    Dispense:  90 tablet    Refill:  0       Patient Instructions  Medication Instructions:   DISCONTINUE aspirin, lopressor (metoprolol), and hydralazine START carvedilol (Coreg) 25mg  by mouth twice daily. START amlodipine (norvasc) 10mg  by mouth daily. INCREASE isosorbide (Imdur) to 60mg  by mouth daily.  *If you need a refill on your cardiac medications before your next appointment, please call your pharmacy*  Lab Work: None ordered  If you have labs (blood work) drawn today and your tests are completely normal, you will receive your results only by: [place] (if you have MyChart) OR A paper copy in the mail If you have any lab test that is abnormal or we need to change your treatment, we will call you to review the results.  Testing/Procedures: None ordered  Follow-Up: At [facility], you and your health needs are our priority.  As part of our continuing mission to provide you with exceptional heart care, we have created designated Provider Care Teams.  These Care Teams include your primary Cardiologist (physician) and Advanced Practice Providers (APPs -  Physician Assistants and Nurse Practitioners) who all work together to provide you with the care you need, when you need it.  We recommend signing up for the patient portal called "MyChart".  Sign up information is provided on this After Visit Summary.  MyChart is used to connect with patients for Virtual Visits (Telemedicine).  Patients are able to view lab/test results, encounter notes, upcoming appointments, etc.  Non-urgent messages can be sent to your provider as well.   To learn more about what you can do with MyChart, go to [email or web address].    Your next appointment:   6 week(s)  The format for your next appointment:   In Person  Provider:   Kate Sable, MD      Signed, Kate Sable, MD  10/17/2021 5:21 PM    [place]

## 2021-11-01 ENCOUNTER — Encounter (HOSPITAL_COMMUNITY): Payer: Self-pay

## 2021-11-01 ENCOUNTER — Other Ambulatory Visit: Payer: Self-pay

## 2021-11-01 ENCOUNTER — Ambulatory Visit (HOSPITAL_COMMUNITY)
Admission: EM | Admit: 2021-11-01 | Discharge: 2021-11-01 | Disposition: A | Discharge status: Home / Self Care | Payer: Medicare HMO | Attending: Emergency Medicine | Admitting: Emergency Medicine

## 2021-11-01 ENCOUNTER — Ambulatory Visit (INDEPENDENT_AMBULATORY_CARE_PROVIDER_SITE_OTHER): Payer: Medicare HMO

## 2021-11-01 DIAGNOSIS — S62011K Displaced fracture of distal pole of navicular [scaphoid] bone of right wrist, subsequent encounter for fracture with nonunion: Secondary | ICD-10-CM | POA: Diagnosis not present

## 2021-11-01 DIAGNOSIS — S52501A Unspecified fracture of the lower end of right radius, initial encounter for closed fracture: Secondary | ICD-10-CM | POA: Diagnosis not present

## 2021-11-01 NOTE — Discharge Instructions (Signed)
Your x-ray today showed a fracture ( break in bone) of right wrist at the base of your thumb and at the wrist where your thumb meets    Please wear you wrist brace at all times, unless sleeping. This is used to protect your injury and prevent further damage. Leave in place until seen by orthopedic specialist. Do not put objects into splint to scratch skin. If numbness or tingling occurs after placement please return to Urgent Care for evaluation.   Follow up with orthopedic specialist in 1-2 weeks. Call practice to make appointment. Information listed below. You may go to any orthopedic provider you deem fit.  Please do not put weight on fracture.

## 2021-11-01 NOTE — ED Triage Notes (Signed)
Pt presents with right wrist pain for past few weeks; is unsure if it is an injury.

## 2021-11-01 NOTE — ED Provider Notes (Signed)
[facility]    CSN: [id number] Arrival date & time: 11/01/21  1525      History   Chief Complaint Chief Complaint  Patient presents with   Wrist Pain    HPI Tara Adams is a 71 y.o. female.   Patient presents with right wrist pain and swelling for weeks after twisting her wrist when getting out of the bed.  Endorses that she felt as if she pulled something.  Has attempted to use brace and ice which has been somewhat helpful but pain has persisted.  Painful to bear weight.  Range of motion is intact but  elicits pain at times.  Associated tingling radiating from the wrist into the lateral arm.  History of CAD, hyperlipidemia, hypertension.     Past Medical History:  Diagnosis Date   Carotid artery occlusion    Coronary artery disease    a. ~ 2002 s/p CABG x 2 ([facility]); b. 03/2021 MV: Inferior, inferolateral, mid and apical anterior ischemia. EF 41%. High risk study.   Hyperlipidemia    Hypertension    Morbid obesity [facility])     Patient Active Problem List   Diagnosis Date Noted   Abnormal cardiovascular stress test    Coronary artery disease of native artery of native heart with stable angina pectoris (HCC)     Past Surgical History:  Procedure Laterality Date   CESAREAN SECTION     CORONARY ARTERY BYPASS GRAFT N/A    LEFT HEART CATH AND CORS/GRAFTS ANGIOGRAPHY N/A 05/20/2021   Procedure: LEFT HEART CATH AND CORS/GRAFTS ANGIOGRAPHY;  Surgeon: Iran Ouch, MD;  Location: [facility] INVASIVE CV LAB;  Service: Cardiovascular;  Laterality: N/A;    OB History   No obstetric history on file.      Home Medications    Prior to Admission medications   Medication Sig Start Date End Date Taking? Authorizing Provider  amLODipine (NORVASC) 10 MG tablet Take 1 tablet (10 mg total) by mouth daily. 10/17/21 01/15/22  Debbe Odea, MD  Ascorbic Acid (VITAMIN C) 1000 MG tablet Take 1,000 mg by mouth daily.    [provider]  atorvastatin  (LIPITOR) 80 MG tablet Take 1 tablet (80 mg total) by mouth daily. 03/31/21   Debbe Odea, MD  butalbital-acetaminophen-caffeine (FIORICET) [phone] MG tablet Take 1-2 tablets by mouth every 4 (four) hours as needed for headache.    [provider]  CALCIUM-VITAMIN D PO Take 1 tablet by mouth daily.    [provider]  carvedilol (COREG) 25 MG tablet Take 1 tablet (25 mg total) by mouth 2 (two) times daily. 10/17/21 01/15/22  Debbe Odea, MD  clopidogrel (PLAVIX) 75 MG tablet Take 1 tablet (75 mg total) by mouth daily. 05/31/21   Debbe Odea, MD  gabapentin (NEURONTIN) 100 MG capsule Take 100 mg by mouth 3 (three) times daily as needed (pain).    [provider]  hydrALAZINE (APRESOLINE) 50 MG tablet TAKE 1 TABLET BY MOUTH THREE TIMES DAILY 08/31/21   Debbe Odea, MD  hydrocortisone cream 0.5 % Apply 1 application topically as needed for itching (Eczema).    [provider]  ibuprofen (ADVIL) 200 MG tablet Take 400-800 mg by mouth every 6 (six) hours as needed for mild pain.    [provider]  isosorbide mononitrate (IMDUR) 60 MG 24 hr tablet Take 1 tablet (60 mg total) by mouth daily. 10/17/21 01/15/22  Debbe Odea, MD  meloxicam (MOBIC) 15 MG tablet Take 15 mg by mouth  daily as needed for pain.    [provider]  Multiple Vitamin (MULTIVITAMIN WITH MINERALS) TABS tablet Take 1 tablet by mouth daily.    [provider]  Multiple Vitamins-Minerals (HAIR/SKIN/NAILS/BIOTIN PO) Take 1 tablet by mouth daily.    [provider]  nitroGLYCERIN (NITROSTAT) 0.4 MG SL tablet Place 0.4 mg under the tongue every 5 (five) minutes as needed for chest pain.    [provider]  tiZANidine (ZANAFLEX) 4 MG tablet Take 4 mg by mouth every 8 (eight) hours as needed for muscle spasms.    [provider]  triamterene-hydrochlorothiazide (MAXZIDE) 75-50 MG tablet Take 1 tablet by mouth daily.     [provider]  vitamin B-12 (CYANOCOBALAMIN) 1000 MCG tablet Take 1,000 mcg by mouth daily.    [provider]    Family History History reviewed. No pertinent family history.  Social History Social History   Tobacco Use   Smoking status: Former    Types: Cigarettes    Quit date: 05/29/1981    Years since quitting: 40.4   Smokeless tobacco: Never  Vaping Use   Vaping Use: Never used  Substance Use Topics   Alcohol use: Never   Drug use: Never     Allergies   Codeine   Review of Systems Review of Systems  Constitutional: Negative.   Respiratory: Negative.    Cardiovascular: Negative.   Musculoskeletal:  Positive for joint swelling. Negative for arthralgias, back pain, gait problem, myalgias, neck pain and neck stiffness.  Skin: Negative.   Neurological: Negative.     Physical Exam Triage Vital Signs ED Triage Vitals  Enc Vitals Group     BP 11/01/21 1600 129/74     Pulse Rate 11/01/21 1600 71     Resp 11/01/21 1600 18     Temp 11/01/21 1600 (!) 97.4 F (36.3 C)     Temp Source 11/01/21 1600 Oral     SpO2 11/01/21 1600 96 %     Weight --      Height --      Head Circumference --      Peak Flow --      Pain Score 11/01/21 1602 7     Pain Loc --      Pain Edu? --      Excl. in GC? --    No data found.  Updated Vital Signs BP 129/74 (BP Location: Right Arm)    Pulse 71    Temp (!) 97.4 F (36.3 C) (Oral)    Resp 18    SpO2 96%   Visual Acuity Right Eye Distance:   Left Eye Distance:   Bilateral Distance:    Right Eye Near:   Left Eye Near:    Bilateral Near:     Physical Exam Constitutional:      Appearance: Normal appearance.  HENT:     Head: Normocephalic.  Eyes:     Extraocular Movements: Extraocular movements intact.  Pulmonary:     Effort: Pulmonary effort is normal.  Musculoskeletal:     Comments: Mild ecchymosis, swelling and tenderness to the radial aspect of the right wrist, range of motion intact, 2+ radial  pulse, sensation intact  Skin:    General: Skin is warm and dry.  Neurological:     Mental Status: She is alert and oriented to person, place, and time. Mental status is at baseline.  Psychiatric:        Mood and Affect: Mood normal.  Behavior: Behavior normal.     UC Treatments / Results  Labs (all labs ordered are listed, but only abnormal results are displayed) Labs Reviewed - No data to display  EKG   Radiology No results found.  Procedures Procedures (including critical care time)  Medications Ordered in UC Medications - No data to display  Initial Impression / Assessment and Plan / UC Course  I have reviewed the triage vital signs and the nursing notes.  Pertinent labs & imaging results that were available during my care of the patient were reviewed by me and considered in my medical decision making (see chart for details).  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  Given nonweightbearing precautions, patient to use topical medications or over-the-counter ibuprofen for management of pain, urgent care follow-up as needed Final Clinical Impressions(s) / UC Diagnoses   Final diagnoses:  None   Discharge Instructions   None    ED Prescriptions   None    PDMP not reviewed this encounter.   Valinda Hoar, NP 11/01/21 1726

## 2021-11-28 ENCOUNTER — Other Ambulatory Visit: Payer: Self-pay

## 2021-11-28 ENCOUNTER — Encounter: Payer: Self-pay | Admitting: Cardiology

## 2021-11-28 ENCOUNTER — Ambulatory Visit: Payer: Medicare HMO | Admitting: Cardiology

## 2021-11-28 VITALS — BP 126/68 | HR 64 | Wt 218.0 lb

## 2021-11-28 DIAGNOSIS — E7800 Pure hypercholesterolemia, unspecified: Secondary | ICD-10-CM | POA: Diagnosis not present

## 2021-11-28 DIAGNOSIS — I2510 Atherosclerotic heart disease of native coronary artery without angina pectoris: Secondary | ICD-10-CM | POA: Diagnosis not present

## 2021-11-28 DIAGNOSIS — R609 Edema, unspecified: Secondary | ICD-10-CM

## 2021-11-28 DIAGNOSIS — I10 Essential (primary) hypertension: Secondary | ICD-10-CM

## 2021-11-28 DIAGNOSIS — Z6837 Body mass index (BMI) 37.0-37.9, adult: Secondary | ICD-10-CM

## 2021-11-28 NOTE — Progress Notes (Signed)
Cardiology Office Note:    Date:  11/28/2021   ID:  Tara Adams, DOB 02/07/1951, MRN [id number]  PCP:  Leanna SatoMiles, Linda M, MD   [facility] Providers Cardiologist:  Debbe OdeaBrian Agbor-Etang, MD     Referring MD: Leanna SatoMiles, Linda M, MD   Chief Complaint  Patient presents with   Other    6 week follow up -- Patient c/o retaining fluid since starting the amlodipine. Meds reviewed verbally with patient.    History of Present Illness:    Tara Adams is a 71 y.o. female with a hx of CAD/CABG x 2 (2002-LIMA-LAD, SVG-LCx), hypertension, hyperlipidemia, left carotid artery stenosis( 60-79%) who presents for follow-up.    Being seen for CAD and hypertension.  Blood pressure medications adjusted after last visit, Coreg, amlodipine was started.  Maxzide was continued.  Lopressor, hydralazine was stopped.  She states blood pressures have been well controlled.  Previously had a left heart cath showing patent LIMA to LAD, SVG to left circumflex was 90% stenosis at distal site.  Medication management recommended.  Imdur was increased to 60 mg daily for antianginal benefit.  She endorses edema after standing for long.  No edema noted in the morning.  She is working on losing some weight and also being more physically active.   Prior notes Echo 05/09/2021 EF 50 to 55%, impaired relaxation.  Mild MR. Left heart cath 05/2021 - significant three-vessel native disease (CTO LAD, Lcx, RCA).  Patent LIMA to LAD, SVG to left circumflex 90% stenosis at the distal insertion site.  Medical management recommended.  Past Medical History:  Diagnosis Date   Carotid artery occlusion    Coronary artery disease    a. ~ 2002 s/p CABG x 2 ([facility]); b. 03/2021 MV: Inferior, inferolateral, mid and apical anterior ischemia. EF 41%. High risk study.   Hyperlipidemia    Hypertension    Morbid obesity (HCC)     Past Surgical History:  Procedure Laterality Date   CESAREAN SECTION     CORONARY ARTERY BYPASS GRAFT  N/A    LEFT HEART CATH AND CORS/GRAFTS ANGIOGRAPHY N/A 05/20/2021   Procedure: LEFT HEART CATH AND CORS/GRAFTS ANGIOGRAPHY;  Surgeon: Iran OuchArida, Muhammad A, MD;  Location: [facility] INVASIVE CV LAB;  Service: Cardiovascular;  Laterality: N/A;    Current Medications: Current Meds  Medication Sig   amLODipine (NORVASC) 10 MG tablet Take 1 tablet (10 mg total) by mouth daily.   Ascorbic Acid (VITAMIN C) 1000 MG tablet Take 1,000 mg by mouth daily.   atorvastatin (LIPITOR) 80 MG tablet Take 1 tablet (80 mg total) by mouth daily.   butalbital-acetaminophen-caffeine (FIORICET) 50-325-40 MG tablet Take 1-2 tablets by mouth every 4 (four) hours as needed for headache.   CALCIUM-VITAMIN D PO Take 1 tablet by mouth daily.   carvedilol (COREG) 25 MG tablet Take 1 tablet (25 mg total) by mouth 2 (two) times daily.   clopidogrel (PLAVIX) 75 MG tablet Take 1 tablet (75 mg total) by mouth daily.   gabapentin (NEURONTIN) 100 MG capsule Take 100 mg by mouth 3 (three) times daily as needed (pain).   hydrocortisone cream 0.5 % Apply 1 application topically as needed for itching (Eczema).   ibuprofen (ADVIL) 200 MG tablet Take 400-800 mg by mouth every 6 (six) hours as needed for mild pain.   isosorbide mononitrate (IMDUR) 60 MG 24 hr tablet Take 1 tablet (60 mg total) by mouth daily.   meloxicam (MOBIC) 15 MG tablet Take 15 mg by mouth daily  as needed for pain.   Multiple Vitamin (MULTIVITAMIN WITH MINERALS) TABS tablet Take 1 tablet by mouth daily.   Multiple Vitamins-Minerals (HAIR/SKIN/NAILS/BIOTIN PO) Take 1 tablet by mouth daily.   nitroGLYCERIN (NITROSTAT) 0.4 MG SL tablet Place 0.4 mg under the tongue every 5 (five) minutes as needed for chest pain.   tiZANidine (ZANAFLEX) 4 MG tablet Take 4 mg by mouth every 8 (eight) hours as needed for muscle spasms.   triamterene-hydrochlorothiazide (MAXZIDE) 75-50 MG tablet Take 1 tablet by mouth daily.   vitamin B-12 (CYANOCOBALAMIN) 1000 MCG tablet Take 1,000 mcg by mouth  daily.   [DISCONTINUED] hydrALAZINE (APRESOLINE) 50 MG tablet TAKE 1 TABLET BY MOUTH THREE TIMES DAILY     Allergies:   Codeine   Social History   Socioeconomic History   Marital status: Single    Spouse name: Not on file   Number of children: Not on file   Years of education: Not on file   Highest education level: Not on file  Occupational History   Not on file  Tobacco Use   Smoking status: Former    Types: Cigarettes    Quit date: 05/29/1981    Years since quitting: 40.5   Smokeless tobacco: Never  Vaping Use   Vaping Use: Never used  Substance and Sexual Activity   Alcohol use: Never   Drug use: Never   Sexual activity: Not on file  Other Topics Concern   Not on file  Social History Narrative   Lives in [place].  Volunteers, driving elderly individuals.  Does not routinely exercise.   Social Determinants of Health   Financial Resource Strain: Not on file  Food Insecurity: Not on file  Transportation Needs: Not on file  Physical Activity: Not on file  Stress: Not on file  Social Connections: Not on file     Family History: The patient's family history is not on file.  ROS:   Please see the history of present illness.     All other systems reviewed and are negative.  EKGs/Labs/Other Studies Reviewed:    The following studies were reviewed today:   EKG:  EKG not ordered today.    Recent Labs: 05/12/2021: BUN 27; Hemoglobin 11.7; Platelets 293; Potassium 4.2; Sodium 146 07/19/2021: Creatinine, Ser 1.10  Recent Lipid Panel    Component Value Date/Time   CHOL 219 (H) 03/28/2021 1501   TRIG 177 (H) 03/28/2021 1501   HDL 58 03/28/2021 1501   CHOLHDL 3.8 03/28/2021 1501   LDLCALC 130 (H) 03/28/2021 1501     Risk Assessment/Calculations:     Physical Exam:    VS:  BP 126/68 (BP Location: Left Arm, Patient Position: Sitting, Cuff Size: Large)    Pulse 64    Wt 218 lb (98.9 kg)    SpO2 98%    BMI 37.42 kg/m     Wt Readings from Last 3 Encounters:   11/28/21 218 lb (98.9 kg)  10/17/21 216 lb (98 kg)  09/02/21 217 lb (98.4 kg)     GEN:  Well nourished, well developed in no acute distress HEENT: Normal NECK: No JVD; No carotid bruits LYMPHATICS: No lymphadenopathy CARDIAC: RRR, 1/6 systolic murmurs, rubs, gallops RESPIRATORY:  Clear to auscultation without rales, wheezing or rhonchi  ABDOMEN: Soft, non-tender, non-distended MUSCULOSKELETAL:  No edema; No deformity  SKIN: Warm and dry NEUROLOGIC:  Alert and oriented x 3 PSYCHIATRIC:  Normal affect   ASSESSMENT:    1. Coronary artery disease involving native heart, unspecified vessel or  lesion type, unspecified whether angina present   2. Primary hypertension   3. Dependent edema   4. Pure hypercholesterolemia   5. BMI 37.0-37.9, adult     PLAN:    In order of problems listed above:  History of CAD/CABG x2 (lima to LAD, SVG to OM in 2002).  LHC 2022 with patent lima to lad, 90%svg to OM.  Medical management advised.  Anginal symptoms improved with Imdur.  Continue Imdur to 60 mg daily.   Echo with EF 50-55%.  Continue aspirin, Lipitor 80mg  Hypertension, BP controlled.  Continue Coreg 25 mg twice daily, amlodipine 10 mg daily,  Imdur to 60 mg daily, Maxzide.   Dependent edema, leg raising advised.  Likely from obesity.  Continue amlodipine as prescribed.  Consider switch to losartan if symptoms persist. Hyperlipidemia, continue Lipitor 80 mg daily.  Plan repeat lipid panel prior to follow-up visit. Obesity, low-calorie diet, weight loss advised.  Follow-up in 6 months.    Medication Adjustments/Labs and Tests Ordered: Current medicines are reviewed at length with the patient today.  Concerns regarding medicines are outlined above.  Orders Placed This Encounter  Procedures   Lipid panel      No orders of the defined types were placed in this encounter.      Patient Instructions  Medication Instructions:   Your physician recommends that you continue on your  current medications as directed. Please refer to the Current Medication list given to you today.  *If you need a refill on your cardiac medications before your next appointment, please call your pharmacy*   Lab Work:  Your physician recommends that you return for a FASTING lipid profile: At your convenience.  - You will need to be fasting. Please do not have anything to eat or drink after midnight the morning you have the lab work. You may only have water or black coffee with no cream or sugar.     Testing/Procedures: None ordered   Follow-Up: At [facility], you and your health needs are our priority.  As part of our continuing mission to provide you with exceptional heart care, we have created designated Provider Care Teams.  These Care Teams include your primary Cardiologist (physician) and Advanced Practice Providers (APPs -  Physician Assistants and Nurse Practitioners) who all work together to provide you with the care you need, when you need it.  We recommend signing up for the patient portal called "MyChart".  Sign up information is provided on this After Visit Summary.  MyChart is used to connect with patients for Virtual Visits (Telemedicine).  Patients are able to view lab/test results, encounter notes, upcoming appointments, etc.  Non-urgent messages can be sent to your provider as well.   To learn more about what you can do with MyChart, go to [facility].    Your next appointment:   6 month(s)  The format for your next appointment:   In Person  Provider:   You may see [email or web address], MD or one of the following Advanced Practice Providers on your designated Care Team:   Debbe Odea, NP Nicolasa Ducking, PA-C Cadence Eula Listen, Fransico Michael    Other Instructions     Signed, [place], MD  11/28/2021 3:45 PM    [facility]

## 2021-11-28 NOTE — Patient Instructions (Addendum)
Medication Instructions:  ? ?Your physician recommends that you continue on your current medications as directed. Please refer to the Current Medication list given to you today. ? ?*If you need a refill on your cardiac medications before your next appointment, please call your pharmacy* ? ? ?Lab Work: ? ?Your physician recommends that you return for a FASTING lipid profile: At your convenience. ? ?- You will need to be fasting. Please do not have anything to eat or drink after midnight the morning you have the lab work. You may only have water or black coffee with no cream or sugar.  ? ? ? ?Testing/Procedures: ?None ordered ? ? ?Follow-Up: ?At [facility], you and your health needs are our priority.  As part of our continuing mission to provide you with exceptional heart care, we have created designated Provider Care Teams.  These Care Teams include your primary Cardiologist (physician) and Advanced Practice Providers (APPs -  Physician Assistants and Nurse Practitioners) who all work together to provide you with the care you need, when you need it. ? ?We recommend signing up for the patient portal called "MyChart".  Sign up information is provided on this After Visit Summary.  MyChart is used to connect with patients for Virtual Visits (Telemedicine).  Patients are able to view lab/test results, encounter notes, upcoming appointments, etc.  Non-urgent messages can be sent to your provider as well.   ?To learn more about what you can do with MyChart, go to [email or web address].   ? ?Your next appointment:   ?6 month(s) ? ?The format for your next appointment:   ?In Person ? ?Provider:   ?You may see Kate Sable, MD or one of the following Advanced Practice Providers on your designated Care Team:   ?Murray Hodgkins, NP ?Christell Faith, PA-C ?Cadence Kathlen Mody, PA-C  ? ? ?Other Instructions ? ? ?

## 2021-12-05 ENCOUNTER — Other Ambulatory Visit: Payer: Medicare HMO

## 2022-01-02 ENCOUNTER — Other Ambulatory Visit (INDEPENDENT_AMBULATORY_CARE_PROVIDER_SITE_OTHER): Payer: Medicare HMO

## 2022-01-02 DIAGNOSIS — E7800 Pure hypercholesterolemia, unspecified: Secondary | ICD-10-CM | POA: Diagnosis not present

## 2022-01-03 LAB — LIPID PANEL
Chol/HDL Ratio: 3.4 ratio (ref 0.0–4.4)
Cholesterol, Total: 179 mg/dL (ref 100–199)
HDL: 52 mg/dL (ref >39)
LDL Chol Calc (NIH): 96 mg/dL (ref 0–99)
Triglycerides: 182 mg/dL — ABNORMAL HIGH (ref 0–149)
VLDL Cholesterol Cal: 31 mg/dL (ref 5–40)

## 2022-01-16 ENCOUNTER — Telehealth: Payer: Self-pay | Admitting: Cardiology

## 2022-01-16 MED ORDER — LOSARTAN POTASSIUM 50 MG PO TABS: 50.0000 mg | ORAL_TABLET | Freq: Every day | ORAL | 3 refills | Status: DC

## 2022-01-16 NOTE — Telephone Encounter (Signed)
Spoke with Dr. Azucena Cecil and he recommended the patient stop taking her Amlodipine and start taking Losartan 50 MG once a day. ? ?Called patient and informed her of the recommendation. She verbalized understanding and agreed with plan. ?

## 2022-01-16 NOTE — Telephone Encounter (Signed)
Pt c/o medication issue: ? ?1. Name of Medication: amLODipine (NORVASC) 10 MG tablet (Expired) ? ?2. How are you currently taking this medication (dosage and times per day)?  Take 1 tablet (10 mg total) by mouth daily. ? ?3. Are you having a reaction (difficulty breathing--STAT)? no ? ?4. What is your medication issue? Causing leg and feet to swell. Calling to see if here is another form of medication she can take. Its calling pain In her leg. Calling in to see if there something else she can. Please advise  ? ? ?Patient also states is okay to leave a VM if she dont answer  ?

## 2022-02-24 ENCOUNTER — Telehealth: Payer: Self-pay | Admitting: Cardiology

## 2022-02-24 NOTE — Telephone Encounter (Signed)
Pt c/o BP issue: STAT if pt c/o blurred vision, one-sided weakness or slurred speech  1. What are your last 5 BP readings?    2. Are you having any other symptoms (ex. Dizziness, headache, blurred vision, passed out)?  Headaches   3. What is your BP issue?   BP is elevated, ranging about 174/94 to as low as 140/92. Patient assumes her BP medication may need to be adjusted.

## 2022-02-27 ENCOUNTER — Telehealth: Payer: Self-pay | Admitting: Cardiology

## 2022-02-27 MED ORDER — LOSARTAN POTASSIUM 100 MG PO TABS: 100.0000 mg | ORAL_TABLET | Freq: Every day | ORAL | 3 refills | Status: DC

## 2022-02-27 NOTE — Telephone Encounter (Addendum)
Spoke with patient and she stated that her last 5 BP readings have been 172/94; 73; 184/96; 66; 174/94; 76; 172/93; 62; 191/101; 66.   She stated that ever since we took her off the Amlodipine (For severe LE swelling)  on 01/16/22, and started her on Losartan, her BP has been elevated and she has SOB, and a couple episodes of CP each day which is relieved by Nitro. Her CP can come on when inactive, and also with increased activity.  Informed patient that I will discuss with Dr. Azucena Cecil and call her back with his recommendations.

## 2022-02-27 NOTE — Telephone Encounter (Signed)
Called patient and have documented on the previous telephone encounter from 02/24/22. Closing this encounter for continuity.

## 2022-02-27 NOTE — Telephone Encounter (Signed)
Pt c/o BP issue: STAT if pt c/o blurred vision, one-sided weakness or slurred speech  1. What are your last 5 BP readings? 172/94; 73; 184/96; 66; 174/94; 76; 172/93; 62; 191/101; 66  2. Are you having any other symptoms (ex. Dizziness, headache, blurred vision, passed out)? SOB, chest pains, headaches, a lil dizziness a week ago  3. What is your BP issue? Patient has been experiencing high BP

## 2022-02-27 NOTE — Telephone Encounter (Signed)
Spoke with Dr. Garen Lah and he recommended that the patient increase her Losartan to 50 MG once a day and schedule a Nurse visit in 2 weeks to check cuff and BP.  Called patient and discussed recommendation. Patient verbalized understanding and agreed with plan. She is scheduled to come in on 03/10/22.

## 2022-03-13 ENCOUNTER — Ambulatory Visit (INDEPENDENT_AMBULATORY_CARE_PROVIDER_SITE_OTHER): Payer: Medicare HMO

## 2022-03-13 VITALS — BP 125/65 | HR 60 | Ht 64.0 in | Wt 215.0 lb

## 2022-03-13 DIAGNOSIS — I10 Essential (primary) hypertension: Secondary | ICD-10-CM

## 2022-03-13 NOTE — Progress Notes (Signed)
   Nurse Visit   Date of Encounter: 03/13/2022 ID: Tara Adams, DOB 1951/03/04, MRN [id number]  PCP:  [name], MD   [facility] Providers Cardiologist:  Debbe Odea, MD      Visit Details   VS:  BP 135/79 (BP Location: Left Arm, Patient Position: Sitting, Cuff Size: Large)   Pulse 60  , BMI There is no height or weight on file to calculate BMI.  Wt Readings from Last 3 Encounters:  11/28/21 218 lb (98.9 kg)  10/17/21 216 lb (98 kg)  09/02/21 217 lb (98.4 kg)    Rechecked patients BP at the end of apt in Rt arm and it was 125/65  Reason for visit: Check pt BP cuff and BP Performed today: BP check, gave a BP monitor to patient, also gave How to take BP at home instructions. Changes (medications, testing, etc.) : None Length of Visit: 20 minutes

## 2022-04-19 ENCOUNTER — Other Ambulatory Visit: Payer: Self-pay

## 2022-04-19 MED ORDER — ATORVASTATIN CALCIUM 80 MG PO TABS: 80.0000 mg | ORAL_TABLET | Freq: Every day | ORAL | 0 refills | Status: AC

## 2022-06-02 ENCOUNTER — Ambulatory Visit: Payer: Medicare HMO | Admitting: Cardiology

## 2022-06-05 ENCOUNTER — Other Ambulatory Visit: Payer: Self-pay | Admitting: Family Medicine

## 2022-06-05 DIAGNOSIS — Z1231 Encounter for screening mammogram for malignant neoplasm of breast: Secondary | ICD-10-CM

## 2022-06-27 ENCOUNTER — Ambulatory Visit: Payer: Medicare HMO

## 2022-06-30 ENCOUNTER — Ambulatory Visit: Payer: Medicare HMO | Attending: Cardiology | Admitting: Cardiology

## 2022-07-03 ENCOUNTER — Encounter: Payer: Self-pay | Admitting: Cardiology

## 2022-10-19 ENCOUNTER — Other Ambulatory Visit: Payer: Self-pay | Admitting: Obstetrics and Gynecology

## 2022-10-19 ENCOUNTER — Encounter: Payer: Self-pay | Admitting: Obstetrics and Gynecology

## 2022-10-19 DIAGNOSIS — R928 Other abnormal and inconclusive findings on diagnostic imaging of breast: Secondary | ICD-10-CM

## 2022-10-19 HISTORY — PX: CATARACT EXTRACTION: SUR2

## 2022-10-24 ENCOUNTER — Other Ambulatory Visit: Payer: Self-pay | Admitting: Obstetrics and Gynecology

## 2022-10-24 DIAGNOSIS — Z13820 Encounter for screening for osteoporosis: Secondary | ICD-10-CM

## 2022-11-03 ENCOUNTER — Other Ambulatory Visit: Payer: Self-pay | Admitting: Obstetrics and Gynecology

## 2022-11-03 ENCOUNTER — Ambulatory Visit
Admission: RE | Admit: 2022-11-03 | Discharge: 2022-11-03 | Disposition: A | Discharge status: Home / Self Care | Payer: Medicare HMO | Source: Ambulatory Visit | Attending: Obstetrics and Gynecology | Admitting: Obstetrics and Gynecology

## 2022-11-03 DIAGNOSIS — R928 Other abnormal and inconclusive findings on diagnostic imaging of breast: Secondary | ICD-10-CM

## 2022-11-03 DIAGNOSIS — N6320 Unspecified lump in the left breast, unspecified quadrant: Secondary | ICD-10-CM

## 2022-11-09 ENCOUNTER — Ambulatory Visit
Admission: RE | Admit: 2022-11-09 | Discharge: 2022-11-09 | Disposition: A | Discharge status: Home / Self Care | Payer: Medicare HMO | Source: Ambulatory Visit | Attending: Obstetrics and Gynecology | Admitting: Obstetrics and Gynecology

## 2022-11-09 DIAGNOSIS — N6320 Unspecified lump in the left breast, unspecified quadrant: Secondary | ICD-10-CM

## 2022-11-09 HISTORY — PX: BREAST BIOPSY: SHX20

## 2022-12-01 ENCOUNTER — Telehealth: Payer: Self-pay | Admitting: *Deleted

## 2022-12-01 NOTE — Telephone Encounter (Signed)
Late entry----11/30/2022 at 3 pm-----spoke with the patient and offered an appt for 3/15, patient declined. Patient scheduled by Maudie Mercury CMA for 3/22

## 2022-12-06 ENCOUNTER — Encounter: Payer: Self-pay | Admitting: Gynecologic Oncology

## 2022-12-06 NOTE — Progress Notes (Unsigned)
The patient called and rescheduled appt for next week.

## 2022-12-07 ENCOUNTER — Encounter: Payer: Self-pay | Admitting: Gynecologic Oncology

## 2022-12-08 ENCOUNTER — Inpatient Hospital Stay: Payer: Medicare HMO | Admitting: Gynecologic Oncology

## 2022-12-08 DIAGNOSIS — C541 Malignant neoplasm of endometrium: Secondary | ICD-10-CM

## 2022-12-12 NOTE — Progress Notes (Unsigned)
GYNECOLOGIC ONCOLOGY NEW PATIENT CONSULTATION   Patient Name: Tara Adams  Patient Age: 72 y.o. Date of Service: 12/14/22 Referring Provider: Earnstine Regal, PA   Primary Care Provider: Marguerita Merles, MD Consulting Provider: Jeral Pinch, MD   Assessment/Plan:  Postmenopausal patient with clinical stage I low-grade endometrioid adenocarcinoma.  We reviewed the nature of endometrial cancer and its recommended surgical staging, including total hysterectomy, bilateral salpingo-oophorectomy, and lymph node assessment. The patient is a suitable candidate for staging via a minimally invasive approach to surgery.  We reviewed that robotic assistance would be used to complete the surgery.   We discussed that most endometrial cancer is detected early and that decisions regarding adjuvant therapy will be made based on her final pathology.   We reviewed the sentinel lymph node technique. Risks and benefits of sentinel lymph node biopsy was reviewed. We reviewed the technique and ICG dye. The patient DOES NOT have an iodine allergy or known liver dysfunction. We reviewed the false negative rate (0.4%), and that 3% of patients with metastatic disease will not have it detected by SLN biopsy in endometrial cancer. A low risk of allergic reaction to the dye, <0.2% for ICG, has been reported. We also discussed that in the case of failed mapping, which occurs 40% of the time, a bilateral or unilateral lymphadenectomy will be performed at the surgeon's discretion.   Potential benefits of sentinel nodes including a higher detection rate for metastasis due to ultrastaging and potential reduction in operative morbidity. However, there remains uncertainty as to the role for treatment of micrometastatic disease. Further, the benefit of operative morbidity associated with the SLN technique in endometrial cancer is not yet completely known. In other patient populations (e.g. the cervical cancer population) there  has been observed reductions in morbidity with SLN biopsy compared to pelvic lymphadenectomy. Lymphedema, nerve dysfunction and lymphocysts are all potential risks with the SLN technique as with complete lymphadenectomy. Additional risks to the patient include the risk of damage to an internal organ while operating in an altered view (e.g. the black and white image of the robotic fluorescence imaging mode).   The patient was consented for a robotic assisted hysterectomy, bilateral salpingo-oophorectomy, sentinel lymph node evaluation, possible lymph node dissection, possible laparotomy. The risks of surgery were discussed in detail and she understands these to include infection; wound separation; hernia; vaginal cuff separation, injury to adjacent organs such as bowel, bladder, blood vessels, ureters and nerves; bleeding which may require blood transfusion; anesthesia risk; thromboembolic events; possible death; unforeseen complications; possible need for re-exploration; medical complications such as heart attack, stroke, pleural effusion and pneumonia; and, if full lymphadenectomy is performed the risk of lymphedema and lymphocyst. The patient will receive DVT and antibiotic prophylaxis as indicated. She voiced a clear understanding. She had the opportunity to ask questions. Perioperative instructions were reviewed with her. Prescriptions for post-op medications were sent to her pharmacy of choice.  Given her cardiac history, will obtain clearance from her cardiologist.  Will schedule surgery in several weeks to allow for sufficient time if any additional testing is needed.    She has never had any sort of colon cancer screening.  I asked if a referral for colonoscopy could be placed today.  Patient amenable.  A copy of this note was sent to the patient's referring provider.   75 minutes of total time was spent for this patient encounter, including preparation, face-to-face counseling with the patient and  coordination of care, and documentation of the encounter.  Jeral Pinch, MD  Division of Gynecologic Oncology  Department of Obstetrics and Gynecology  [facility]  ___________________________________________  Chief Complaint: Chief Complaint  Patient presents with   Endometrial cancer [facility])    History of Present Illness:  Tara Adams is a 72 y.o. y.o. female who is seen in consultation at the request of Earnstine Regal, [place] for an evaluation of endometrial cancer.  The patient presented on 2/5 with PMB. First episode occurred in December with second episode in January. Both episodes with light bleeding, first x3 days and second for 1 day.    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 MMR IHC intact.    Ultrasound in the office showed 9.1 x 6.1 x 5.8 cm with endometrial lining of 29 mm. Multiple fibroids measuring up to 4.4 cm.   Today, the patient reports that rare spotting started several years ago.  She had 1 episode of heavier bleeding after being started on aspirin and Plavix about 2 years ago.  This resolved when she came off of Plavix as well as stopped her aspirin for a brief period of time.  Since then, she notes rare episodes of light spotting, enough to wear a pad.  She has some associated cramping with the spotting.  She endorses normal bowel function.  She reports some urinary frequency at baseline.  She is working on cutting back on eating and has lost a small amount of weight.  Denies any significant weight changes or changes in appetite.  Patient has a history of a CABG in 2002.  She underwent cardiac catheterization last in 2022.  She sees a cardiologist regularly, last visit she thinks was about 6 months ago.  Last echo in 2022 showed an EF of 50-55%.  PAST MEDICAL HISTORY:  Past Medical History:  Diagnosis Date   Carotid artery occlusion    Coronary artery disease    a. ~ 2002 s/p CABG x 2  ([facility]); b. 03/2021 MV: Inferior, inferolateral, mid and apical anterior ischemia. EF 41%. High risk study.   Hyperlipidemia    Hypertension    Morbid obesity ([place])    Sciatica      PAST SURGICAL HISTORY:  Past Surgical History:  Procedure Laterality Date   BREAST BIOPSY Left 11/09/2022   pathology - no cancer   BREAST BIOPSY Left 11/09/2022   [place] LT BREAST BX W LOC DEV 1ST LESION IMG BX SPEC US GUIDE 11/09/2022 [facility] MAMMOGRAPHY   CATARACT EXTRACTION Bilateral 10/2022   CESAREAN SECTION     CORONARY ARTERY BYPASS GRAFT N/A    LEFT HEART CATH AND CORS/GRAFTS ANGIOGRAPHY N/A 05/20/2021   Procedure: LEFT HEART CATH AND CORS/GRAFTS ANGIOGRAPHY;  Surgeon: Wellington Hampshire, MD;  Location: [place] [facility];  Service: Cardiovascular;  Laterality: N/A;    OB/GYN HISTORY:  OB History  Gravida Para Term Preterm AB Living  2 1          SAB IAB Ectopic Multiple Live Births               # Outcome Date GA Lbr Len/2nd Weight Sex Delivery Anes PTL Lv  2 Gravida           1 Para             No LMP recorded. Patient is postmenopausal.  Age at menarche: 48  Age at menopause: 19 Hx of HRT: denies Hx of STDs: denies Last pap: 5 years ago History  of abnormal pap smears: Denies  SCREENING STUDIES:  Last mammogram: 2024  Last colonoscopy: n/a  MEDICATIONS: Outpatient Encounter Medications as of 12/14/2022  Medication Sig   aspirin EC 81 MG tablet Take 81 mg by mouth daily. Swallow whole.   senna-docusate (SENOKOT-S) 8.6-50 MG tablet Take 2 tablets by mouth at bedtime. For AFTER surgery, do not take if having diarrhea   traMADol (ULTRAM) 50 MG tablet Take 1 tablet (50 mg total) by mouth every 6 (six) hours as needed for severe pain. For AFTER surgery, do not take and drive   Ascorbic Acid (VITAMIN C) 1000 MG tablet Take 1,000 mg by mouth daily.   atorvastatin (LIPITOR) 80 MG tablet Take 1 tablet (80 mg total) by mouth daily.   butalbital-acetaminophen-caffeine (FIORICET)  50-325-40 MG tablet Take 1-2 tablets by mouth every 4 (four) hours as needed for headache.   CALCIUM-VITAMIN D PO Take 1 tablet by mouth daily.   carvedilol (COREG) 25 MG tablet Take 1 tablet (25 mg total) by mouth 2 (two) times daily.   gabapentin (NEURONTIN) 100 MG capsule Take 100 mg by mouth as needed (pain). 1-2 every few months   hydrocortisone cream 0.5 % Apply 1 application topically as needed for itching (Eczema).   ibuprofen (ADVIL) 200 MG tablet Take 400-800 mg by mouth every 6 (six) hours as needed for mild pain.   isosorbide mononitrate (IMDUR) 60 MG 24 hr tablet Take 1 tablet (60 mg total) by mouth daily. (Patient taking differently: Take 30 mg by mouth at bedtime.)   losartan (COZAAR) 100 MG tablet Take 1 tablet (100 mg total) by mouth daily.   Multiple Vitamin (MULTIVITAMIN WITH MINERALS) TABS tablet Take 1 tablet by mouth daily.   Multiple Vitamins-Minerals (HAIR/SKIN/NAILS/BIOTIN PO) Take 1 tablet by mouth daily.   nitroGLYCERIN (NITROSTAT) 0.4 MG SL tablet Place 0.4 mg under the tongue every 5 (five) minutes as needed for chest pain.   tiZANidine (ZANAFLEX) 4 MG tablet Take 4 mg by mouth every 8 (eight) hours as needed for muscle spasms.   triamterene-hydrochlorothiazide (MAXZIDE) 75-50 MG tablet Take 1 tablet by mouth daily.   vitamin B-12 (CYANOCOBALAMIN) 1000 MCG tablet Take 1,000 mcg by mouth daily.   [DISCONTINUED] clopidogrel (PLAVIX) 75 MG tablet Take 1 tablet (75 mg total) by mouth daily. (Patient not taking: Reported on 12/14/2022)   [DISCONTINUED] meloxicam (MOBIC) 15 MG tablet Take 15 mg by mouth daily as needed for pain.   No facility-administered encounter medications on file as of 12/14/2022.    ALLERGIES:  Allergies  Allergen Reactions   Codeine Itching     FAMILY HISTORY:  Family History  Problem Relation Age of Onset   Breast cancer Maternal Aunt    Colon cancer Neg Hx    Ovarian cancer Neg Hx    Endometrial cancer Neg Hx    Pancreatic cancer Neg Hx     Prostate cancer Neg Hx      SOCIAL HISTORY:  Social Connections: Not on file    REVIEW OF SYSTEMS:  Denies appetite changes, fevers, chills, fatigue, unexplained weight changes. Denies hearing loss, neck lumps or masses, mouth sores, ringing in ears or voice changes. Denies cough or wheezing.  Denies shortness of breath. Denies chest pain or palpitations. Denies leg swelling. Denies abdominal distention, pain, blood in stools, constipation, diarrhea, nausea, vomiting, or early satiety. Denies pain with intercourse, dysuria, frequency, hematuria or incontinence. Denies hot flashes, pelvic pain, vaginal bleeding or vaginal discharge.   Denies joint pain, back pain  or muscle pain/cramps. Denies itching, rash, or wounds. Denies dizziness, headaches, numbness or seizures. Denies swollen lymph nodes or glands, denies easy bruising or bleeding. Denies anxiety, depression, confusion, or decreased concentration.  Physical Exam:  Vital Signs for this encounter:  Blood pressure 137/72, pulse 68, temperature 98.3 F (36.8 C), temperature source Oral, resp. rate 16, height 5\' 4"  (1.626 m), weight 207 lb (93.9 kg), SpO2 98 %. Body mass index is 35.53 kg/m. General: Alert, oriented, no acute distress.  HEENT: Normocephalic, atraumatic. Sclera anicteric.  Chest: Clear to auscultation bilaterally. No wheezes, rhonchi, or rales. Cardiovascular: Regular rate and rhythm, no murmurs, rubs, or gallops.  Abdomen: Obese. Normoactive bowel sounds. Soft, nondistended, nontender to palpation. No masses or hepatosplenomegaly appreciated. No palpable fluid wave.  Extremities: Grossly normal range of motion. Warm, well perfused. No edema bilaterally.  Skin: No rashes or lesions.  Lymphatics: No cervical, supraclavicular, or inguinal adenopathy.  GU:  Normal external female genitalia. No lesions. No discharge or bleeding.             Bladder/urethra:  No lesions or masses, well supported bladder              Vagina: Mildly atrophic, no lesions.  Minimal discharge noted.             Cervix: Normal appearing, no lesions.             Uterus: 8-10 cm, mobile, no parametrial involvement or nodularity.             Adnexa: No masses appreciated.  Rectal: Deferred.  LABORATORY AND RADIOLOGIC DATA:  Outside medical records were reviewed to synthesize the above history, along with the history and physical obtained during the visit.   Lab Results  Component Value Date   WBC 7.2 05/12/2021   HGB 11.7 05/12/2021   HCT 35.7 05/12/2021   PLT 293 05/12/2021   GLUCOSE 74 05/12/2021   CHOL 179 01/02/2022   TRIG 182 (H) 01/02/2022   HDL 52 01/02/2022   LDLCALC 96 01/02/2022   NA 146 (H) 05/12/2021   K 4.2 05/12/2021   CL 106 05/12/2021   CREATININE 1.10 (H) 07/19/2021   BUN 27 05/12/2021   CO2 23 05/12/2021

## 2022-12-12 NOTE — H&P (View-Only) (Signed)
GYNECOLOGIC ONCOLOGY NEW PATIENT CONSULTATION   Patient Name: Tara Adams  Patient Age: 71 y.o. Date of Service: 12/14/22 Referring Provider: Elmira Powell, PA   Primary Care Provider: Miles, Linda M, MD Consulting Provider: Rashida Ladouceur, MD   Assessment/Plan:  Postmenopausal patient with clinical stage I low-grade endometrioid adenocarcinoma.  We reviewed the nature of endometrial cancer and its recommended surgical staging, including total hysterectomy, bilateral salpingo-oophorectomy, and lymph node assessment. The patient is a suitable candidate for staging via a minimally invasive approach to surgery.  We reviewed that robotic assistance would be used to complete the surgery.   We discussed that most endometrial cancer is detected early and that decisions regarding adjuvant therapy will be made based on her final pathology.   We reviewed the sentinel lymph node technique. Risks and benefits of sentinel lymph node biopsy was reviewed. We reviewed the technique and ICG dye. The patient DOES NOT have an iodine allergy or known liver dysfunction. We reviewed the false negative rate (0.4%), and that 3% of patients with metastatic disease will not have it detected by SLN biopsy in endometrial cancer. A low risk of allergic reaction to the dye, <0.2% for ICG, has been reported. We also discussed that in the case of failed mapping, which occurs 40% of the time, a bilateral or unilateral lymphadenectomy will be performed at the surgeon's discretion.   Potential benefits of sentinel nodes including a higher detection rate for metastasis due to ultrastaging and potential reduction in operative morbidity. However, there remains uncertainty as to the role for treatment of micrometastatic disease. Further, the benefit of operative morbidity associated with the SLN technique in endometrial cancer is not yet completely known. In other patient populations (e.g. the cervical cancer population) there  has been observed reductions in morbidity with SLN biopsy compared to pelvic lymphadenectomy. Lymphedema, nerve dysfunction and lymphocysts are all potential risks with the SLN technique as with complete lymphadenectomy. Additional risks to the patient include the risk of damage to an internal organ while operating in an altered view (e.g. the black and white image of the robotic fluorescence imaging mode).   The patient was consented for a robotic assisted hysterectomy, bilateral salpingo-oophorectomy, sentinel lymph node evaluation, possible lymph node dissection, possible laparotomy. The risks of surgery were discussed in detail and she understands these to include infection; wound separation; hernia; vaginal cuff separation, injury to adjacent organs such as bowel, bladder, blood vessels, ureters and nerves; bleeding which may require blood transfusion; anesthesia risk; thromboembolic events; possible death; unforeseen complications; possible need for re-exploration; medical complications such as heart attack, stroke, pleural effusion and pneumonia; and, if full lymphadenectomy is performed the risk of lymphedema and lymphocyst. The patient will receive DVT and antibiotic prophylaxis as indicated. She voiced a clear understanding. She had the opportunity to ask questions. Perioperative instructions were reviewed with her. Prescriptions for post-op medications were sent to her pharmacy of choice.  Given her cardiac history, will obtain clearance from her cardiologist.  Will schedule surgery in several weeks to allow for sufficient time if any additional testing is needed.    She has never had any sort of colon cancer screening.  I asked if a referral for colonoscopy could be placed today.  Patient amenable.  A copy of this note was sent to the patient's referring provider.   75 minutes of total time was spent for this patient encounter, including preparation, face-to-face counseling with the patient and  coordination of care, and documentation of the encounter.    Rollande Thursby, MD  Division of Gynecologic Oncology  Department of Obstetrics and Gynecology  [facility]  ___________________________________________  Chief Complaint: Chief Complaint  Patient presents with   Endometrial cancer (HCC)    History of Present Illness:  Tara Adams is a 71 y.o. y.o. female who is seen in consultation at the request of Elmira Powell, PA for an evaluation of endometrial cancer.  The patient presented on 2/5 with PMB. First episode occurred in December with second episode in January. Both episodes with light bleeding, first x3 days and second for 1 day.    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 MMR IHC intact.    Ultrasound in the office showed 9.1 x 6.1 x 5.8 cm with endometrial lining of 29 mm. Multiple fibroids measuring up to 4.4 cm.   Today, the patient reports that rare spotting started several years ago.  She had 1 episode of heavier bleeding after being started on aspirin and Plavix about 2 years ago.  This resolved when she came off of Plavix as well as stopped her aspirin for a brief period of time.  Since then, she notes rare episodes of light spotting, enough to wear a pad.  She has some associated cramping with the spotting.  She endorses normal bowel function.  She reports some urinary frequency at baseline.  She is working on cutting back on eating and has lost a small amount of weight.  Denies any significant weight changes or changes in appetite.  Patient has a history of a CABG in 2002.  She underwent cardiac catheterization last in 2022.  She sees a cardiologist regularly, last visit she thinks was about 6 months ago.  Last echo in 2022 showed an EF of 50-55%.  PAST MEDICAL HISTORY:  Past Medical History:  Diagnosis Date   Carotid artery occlusion    Coronary artery disease    a. ~ 2002 s/p CABG x 2  ([facility]); b. 03/2021 MV: Inferior, inferolateral, mid and apical anterior ischemia. EF 41%. High risk study.   Hyperlipidemia    Hypertension    Morbid obesity (HCC)    Sciatica      PAST SURGICAL HISTORY:  Past Surgical History:  Procedure Laterality Date   BREAST BIOPSY Left 11/09/2022   pathology - no cancer   BREAST BIOPSY Left 11/09/2022   US LT BREAST BX W LOC DEV 1ST LESION IMG BX SPEC US GUIDE 11/09/2022 [facility] MAMMOGRAPHY   CATARACT EXTRACTION Bilateral 10/2022   CESAREAN SECTION     CORONARY ARTERY BYPASS GRAFT N/A    LEFT HEART CATH AND CORS/GRAFTS ANGIOGRAPHY N/A 05/20/2021   Procedure: LEFT HEART CATH AND CORS/GRAFTS ANGIOGRAPHY;  Surgeon: Arida, Muhammad A, MD;  Location: [facility] INVASIVE CV LAB;  Service: Cardiovascular;  Laterality: N/A;    OB/GYN HISTORY:  OB History  Gravida Para Term Preterm AB Living  2 1          SAB IAB Ectopic Multiple Live Births               # Outcome Date GA Lbr Len/2nd Weight Sex Delivery Anes PTL Lv  2 Gravida           1 Para             No LMP recorded. Patient is postmenopausal.  Age at menarche: 12  Age at menopause: 53 Hx of HRT: denies Hx of STDs: denies Last pap: 5 years ago History   of abnormal pap smears: Denies  SCREENING STUDIES:  Last mammogram: 2024  Last colonoscopy: n/a  MEDICATIONS: Outpatient Encounter Medications as of 12/14/2022  Medication Sig   aspirin EC 81 MG tablet Take 81 mg by mouth daily. Swallow whole.   senna-docusate (SENOKOT-S) 8.6-50 MG tablet Take 2 tablets by mouth at bedtime. For AFTER surgery, do not take if having diarrhea   traMADol (ULTRAM) 50 MG tablet Take 1 tablet (50 mg total) by mouth every 6 (six) hours as needed for severe pain. For AFTER surgery, do not take and drive   Ascorbic Acid (VITAMIN C) 1000 MG tablet Take 1,000 mg by mouth daily.   atorvastatin (LIPITOR) 80 MG tablet Take 1 tablet (80 mg total) by mouth daily.   butalbital-acetaminophen-caffeine (FIORICET)  50-325-40 MG tablet Take 1-2 tablets by mouth every 4 (four) hours as needed for headache.   CALCIUM-VITAMIN D PO Take 1 tablet by mouth daily.   carvedilol (COREG) 25 MG tablet Take 1 tablet (25 mg total) by mouth 2 (two) times daily.   gabapentin (NEURONTIN) 100 MG capsule Take 100 mg by mouth as needed (pain). 1-2 every few months   hydrocortisone cream 0.5 % Apply 1 application topically as needed for itching (Eczema).   ibuprofen (ADVIL) 200 MG tablet Take 400-800 mg by mouth every 6 (six) hours as needed for mild pain.   isosorbide mononitrate (IMDUR) 60 MG 24 hr tablet Take 1 tablet (60 mg total) by mouth daily. (Patient taking differently: Take 30 mg by mouth at bedtime.)   losartan (COZAAR) 100 MG tablet Take 1 tablet (100 mg total) by mouth daily.   Multiple Vitamin (MULTIVITAMIN WITH MINERALS) TABS tablet Take 1 tablet by mouth daily.   Multiple Vitamins-Minerals (HAIR/SKIN/NAILS/BIOTIN PO) Take 1 tablet by mouth daily.   nitroGLYCERIN (NITROSTAT) 0.4 MG SL tablet Place 0.4 mg under the tongue every 5 (five) minutes as needed for chest pain.   tiZANidine (ZANAFLEX) 4 MG tablet Take 4 mg by mouth every 8 (eight) hours as needed for muscle spasms.   triamterene-hydrochlorothiazide (MAXZIDE) 75-50 MG tablet Take 1 tablet by mouth daily.   vitamin B-12 (CYANOCOBALAMIN) 1000 MCG tablet Take 1,000 mcg by mouth daily.   [DISCONTINUED] clopidogrel (PLAVIX) 75 MG tablet Take 1 tablet (75 mg total) by mouth daily. (Patient not taking: Reported on 12/14/2022)   [DISCONTINUED] meloxicam (MOBIC) 15 MG tablet Take 15 mg by mouth daily as needed for pain.   No facility-administered encounter medications on file as of 12/14/2022.    ALLERGIES:  Allergies  Allergen Reactions   Codeine Itching     FAMILY HISTORY:  Family History  Problem Relation Age of Onset   Breast cancer Maternal Aunt    Colon cancer Neg Hx    Ovarian cancer Neg Hx    Endometrial cancer Neg Hx    Pancreatic cancer Neg Hx     Prostate cancer Neg Hx      SOCIAL HISTORY:  Social Connections: Not on file    REVIEW OF SYSTEMS:  Denies appetite changes, fevers, chills, fatigue, unexplained weight changes. Denies hearing loss, neck lumps or masses, mouth sores, ringing in ears or voice changes. Denies cough or wheezing.  Denies shortness of breath. Denies chest pain or palpitations. Denies leg swelling. Denies abdominal distention, pain, blood in stools, constipation, diarrhea, nausea, vomiting, or early satiety. Denies pain with intercourse, dysuria, frequency, hematuria or incontinence. Denies hot flashes, pelvic pain, vaginal bleeding or vaginal discharge.   Denies joint pain, back pain   or muscle pain/cramps. Denies itching, rash, or wounds. Denies dizziness, headaches, numbness or seizures. Denies swollen lymph nodes or glands, denies easy bruising or bleeding. Denies anxiety, depression, confusion, or decreased concentration.  Physical Exam:  Vital Signs for this encounter:  Blood pressure 137/72, pulse 68, temperature 98.3 F (36.8 C), temperature source Oral, resp. rate 16, height 5' 4" (1.626 m), weight 207 lb (93.9 kg), SpO2 98 %. Body mass index is 35.53 kg/m. General: Alert, oriented, no acute distress.  HEENT: Normocephalic, atraumatic. Sclera anicteric.  Chest: Clear to auscultation bilaterally. No wheezes, rhonchi, or rales. Cardiovascular: Regular rate and rhythm, no murmurs, rubs, or gallops.  Abdomen: Obese. Normoactive bowel sounds. Soft, nondistended, nontender to palpation. No masses or hepatosplenomegaly appreciated. No palpable fluid wave.  Extremities: Grossly normal range of motion. Warm, well perfused. No edema bilaterally.  Skin: No rashes or lesions.  Lymphatics: No cervical, supraclavicular, or inguinal adenopathy.  GU:  Normal external female genitalia. No lesions. No discharge or bleeding.             Bladder/urethra:  No lesions or masses, well supported bladder              Vagina: Mildly atrophic, no lesions.  Minimal discharge noted.             Cervix: Normal appearing, no lesions.             Uterus: 8-10 cm, mobile, no parametrial involvement or nodularity.             Adnexa: No masses appreciated.  Rectal: Deferred.  LABORATORY AND RADIOLOGIC DATA:  Outside medical records were reviewed to synthesize the above history, along with the history and physical obtained during the visit.   Lab Results  Component Value Date   WBC 7.2 05/12/2021   HGB 11.7 05/12/2021   HCT 35.7 05/12/2021   PLT 293 05/12/2021   GLUCOSE 74 05/12/2021   CHOL 179 01/02/2022   TRIG 182 (H) 01/02/2022   HDL 52 01/02/2022   LDLCALC 96 01/02/2022   NA 146 (H) 05/12/2021   K 4.2 05/12/2021   CL 106 05/12/2021   CREATININE 1.10 (H) 07/19/2021   BUN 27 05/12/2021   CO2 23 05/12/2021    

## 2022-12-14 ENCOUNTER — Inpatient Hospital Stay (HOSPITAL_BASED_OUTPATIENT_CLINIC_OR_DEPARTMENT_OTHER): Payer: Medicare HMO | Admitting: Gynecologic Oncology

## 2022-12-14 ENCOUNTER — Encounter: Payer: Self-pay | Admitting: Gynecologic Oncology

## 2022-12-14 ENCOUNTER — Other Ambulatory Visit: Payer: Self-pay

## 2022-12-14 ENCOUNTER — Inpatient Hospital Stay: Payer: Medicare HMO | Attending: Gynecologic Oncology | Admitting: Gynecologic Oncology

## 2022-12-14 VITALS — BP 137/72 | HR 68 | Temp 98.3°F | Resp 16 | Ht 64.0 in | Wt 207.0 lb

## 2022-12-14 DIAGNOSIS — Z803 Family history of malignant neoplasm of breast: Secondary | ICD-10-CM | POA: Insufficient documentation

## 2022-12-14 DIAGNOSIS — I10 Essential (primary) hypertension: Secondary | ICD-10-CM | POA: Diagnosis not present

## 2022-12-14 DIAGNOSIS — Z6835 Body mass index (BMI) 35.0-35.9, adult: Secondary | ICD-10-CM | POA: Insufficient documentation

## 2022-12-14 DIAGNOSIS — M5430 Sciatica, unspecified side: Secondary | ICD-10-CM | POA: Insufficient documentation

## 2022-12-14 DIAGNOSIS — I2510 Atherosclerotic heart disease of native coronary artery without angina pectoris: Secondary | ICD-10-CM | POA: Diagnosis not present

## 2022-12-14 DIAGNOSIS — Z780 Asymptomatic menopausal state: Secondary | ICD-10-CM | POA: Insufficient documentation

## 2022-12-14 DIAGNOSIS — E66812 Obesity, class 2: Secondary | ICD-10-CM

## 2022-12-14 DIAGNOSIS — Z79899 Other long term (current) drug therapy: Secondary | ICD-10-CM | POA: Insufficient documentation

## 2022-12-14 DIAGNOSIS — E785 Hyperlipidemia, unspecified: Secondary | ICD-10-CM | POA: Diagnosis not present

## 2022-12-14 DIAGNOSIS — R350 Frequency of micturition: Secondary | ICD-10-CM | POA: Insufficient documentation

## 2022-12-14 DIAGNOSIS — Z951 Presence of aortocoronary bypass graft: Secondary | ICD-10-CM | POA: Diagnosis not present

## 2022-12-14 DIAGNOSIS — C541 Malignant neoplasm of endometrium: Secondary | ICD-10-CM

## 2022-12-14 DIAGNOSIS — Z1211 Encounter for screening for malignant neoplasm of colon: Secondary | ICD-10-CM | POA: Diagnosis not present

## 2022-12-14 DIAGNOSIS — I6529 Occlusion and stenosis of unspecified carotid artery: Secondary | ICD-10-CM | POA: Insufficient documentation

## 2022-12-14 DIAGNOSIS — E669 Obesity, unspecified: Secondary | ICD-10-CM

## 2022-12-14 DIAGNOSIS — Z7982 Long term (current) use of aspirin: Secondary | ICD-10-CM | POA: Diagnosis not present

## 2022-12-14 DIAGNOSIS — N950 Postmenopausal bleeding: Secondary | ICD-10-CM

## 2022-12-14 DIAGNOSIS — I25118 Atherosclerotic heart disease of native coronary artery with other forms of angina pectoris: Secondary | ICD-10-CM

## 2022-12-14 MED ORDER — TRAMADOL HCL 50 MG PO TABS: 50.0000 mg | ORAL_TABLET | Freq: Four times a day (QID) | ORAL | 0 refills | Status: DC | PRN

## 2022-12-14 MED ORDER — SENNOSIDES-DOCUSATE SODIUM 8.6-50 MG PO TABS: 2.0000 | ORAL_TABLET | Freq: Every day | ORAL | 0 refills | Status: AC

## 2022-12-14 NOTE — Patient Instructions (Addendum)
We will place a referral to GI for a screening colonoscopy. You should receive a phone call with this appointment.  We will also reach out to your cardiologist to obtain risk stratification and optimization for surgery. WE WILL ALSO OBTAIN PLAVIX RECOMMENDATIONS AND LET YOU KNOW.  Preparing for your Surgery  Plan for surgery on January 04, 2023 with Dr. Jeral Pinch at [place] will be scheduled for robotic assisted total laparoscopic hysterectomy (removal of the uterus and cervix), bilateral salpingo-oophorectomy (removal of both ovaries and fallopian tubes), sentinel lymph node biopsy, possible lymph node dissection, possible laparotomy (larger incision on your abdomen if needed).   Pre-operative Testing -You will receive a phone call from presurgical testing at [facility] to arrange for a pre-operative appointment and lab work.  -Bring your insurance card, copy of an advanced directive if applicable, medication list  -At that visit, you will be asked to sign a consent for a possible blood transfusion in case a transfusion becomes necessary during surgery.  The need for a blood transfusion is rare but having consent is a necessary part of your care.     -We will let you know when to stop taking your plavix before surgery.  -Do not take supplements such as fish oil (omega 3), red yeast rice, turmeric before your surgery. You want to avoid medications with aspirin in them including headache powders such as BC or Goody's), Excedrin migraine.  YOU WILL NEED TO STOP TAKING MOBIC (MELOXICAM) AT LEAST 7 DAYS BEFORE SURGERY.  Day Before Surgery at [place] will be asked to take in a light diet the day before surgery. You will be advised you can have clear liquids up until 3 hours before your surgery.    Eat a light diet the day before surgery.  Examples including soups, broths, toast, yogurt, mashed potatoes.  AVOID GAS PRODUCING FOODS AND BEVERAGES. Things to avoid  include carbonated beverages (fizzy beverages, sodas), raw fruits and raw vegetables (uncooked), or beans.   If your bowels are filled with gas, your surgeon will have difficulty visualizing your pelvic organs which increases your surgical risks.  Your role in recovery Your role is to become active as soon as directed by your doctor, while still giving yourself time to heal.  Rest when you feel tired. You will be asked to do the following in order to speed your recovery:  - Cough and breathe deeply. This helps to clear and expand your lungs and can prevent pneumonia after surgery.  - Vanduser. Do mild physical activity. Walking or moving your legs help your circulation and body functions return to normal. Do not try to get up or walk alone the first time after surgery.   -If you develop swelling on one leg or the other, pain in the back of your leg, redness/warmth in one of your legs, please call the office or go to the Emergency Room to have a doppler to rule out a blood clot. For shortness of breath, chest pain-seek care in the Emergency Room as soon as possible. - Actively manage your pain. Managing your pain lets you move in comfort. We will ask you to rate your pain on a scale of zero to 10. It is your responsibility to tell your doctor or nurse where and how much you hurt so your pain can be treated.  Special Considerations -If you are diabetic, you may be placed on insulin after surgery to have closer  control over your blood sugars to promote healing and recovery.  This does not mean that you will be discharged on insulin.  If applicable, your oral antidiabetics will be resumed when you are tolerating a solid diet.  -Your final pathology results from surgery should be available around one week after surgery and the results will be relayed to you when available.  -FMLA forms can be faxed to [phone] and please allow 5-7 business days for completion.  Pain Management  After Surgery -You have been prescribed your pain medication and bowel regimen medications before surgery so that you can have these available when you are discharged from the hospital. The pain medication is for use ONLY AFTER surgery and a new prescription will not be given.   -Make sure that you have Tylenol IF YOU ARE ABLE TO TAKE THESE MEDICATION at home to use on a regular basis after surgery for pain control. You can continue use of mobic.  -Review the attached handout on narcotic use and their risks and side effects.   Bowel Regimen -You have been prescribed Sennakot-S to take nightly to prevent constipation especially if you are taking the narcotic pain medication intermittently.  It is important to prevent constipation and drink adequate amounts of liquids. You can stop taking this medication when you are not taking pain medication and you are back on your normal bowel routine.  Risks of Surgery Risks of surgery are low but include bleeding, infection, damage to surrounding structures, re-operation, blood clots, and very rarely death.   Blood Transfusion Information (For the consent to be signed before surgery)  We will be checking your blood type before surgery so in case of emergencies, we will know what type of blood you would need.                                            WHAT IS A BLOOD TRANSFUSION?  A transfusion is the replacement of blood or some of its parts. Blood is made up of multiple cells which provide different functions. Red blood cells carry oxygen and are used for blood loss replacement. White blood cells fight against infection. Platelets control bleeding. Plasma helps clot blood. Other blood products are available for specialized needs, such as hemophilia or other clotting disorders. BEFORE THE TRANSFUSION  Who gives blood for transfusions?  You may be able to donate blood to be used at a later date on yourself (autologous donation). Relatives can be asked  to donate blood. This is generally not any safer than if you have received blood from a stranger. The same precautions are taken to ensure safety when a relative's blood is donated. Healthy volunteers who are fully evaluated to make sure their blood is safe. This is blood bank blood. Transfusion therapy is the safest it has ever been in the practice of medicine. Before blood is taken from a donor, a complete history is taken to make sure that person has no history of diseases nor engages in risky social behavior (examples are intravenous drug use or sexual activity with multiple partners). The donor's travel history is screened to minimize risk of transmitting infections, such as malaria. The donated blood is tested for signs of infectious diseases, such as HIV and hepatitis. The blood is then tested to be sure it is compatible with you in order to minimize the chance of a transfusion  reaction. If you or a relative donates blood, this is often done in anticipation of surgery and is not appropriate for emergency situations. It takes many days to process the donated blood. RISKS AND COMPLICATIONS Although transfusion therapy is very safe and saves many lives, the main dangers of transfusion include:  Getting an infectious disease. Developing a transfusion reaction. This is an allergic reaction to something in the blood you were given. Every precaution is taken to prevent this. The decision to have a blood transfusion has been considered carefully by your caregiver before blood is given. Blood is not given unless the benefits outweigh the risks.  AFTER SURGERY INSTRUCTIONS  Return to work: 4-6 weeks if applicable  Activity: 1. Be up and out of the bed during the day.  Take a nap if needed.  You may walk up steps but be careful and use the hand rail.  Stair climbing will tire you more than you think, you may need to stop part way and rest.   2. No lifting or straining for 6 weeks over 10 pounds. No  pushing, pulling, straining for 6 weeks.  3. No driving for around 1 week(s).  Do not drive if you are taking narcotic pain medicine and make sure that your reaction time has returned.   4. You can shower as soon as the next day after surgery. Shower daily.  Use your regular soap and water (not directly on the incision) and pat your incision(s) dry afterwards; don't rub.  No tub baths or submerging your body in water until cleared by your surgeon. If you have the soap that was given to you by pre-surgical testing that was used before surgery, you do not need to use it afterwards because this can irritate your incisions.   5. No sexual activity and nothing in the vagina for 10-12 weeks.  6. You may experience a small amount of clear drainage from your incisions, which is normal.  If the drainage persists, increases, or changes color please call the office.  7. Do not use creams, lotions, or ointments such as neosporin on your incisions after surgery until advised by your surgeon because they can cause removal of the dermabond glue on your incisions.    8. You may experience vaginal spotting after surgery or around the 6-8 week mark from surgery when the stitches at the top of the vagina begin to dissolve.  The spotting is normal but if you experience heavy bleeding, call our office.  9. Take Tylenol first for pain if you are able to take these medication and only use narcotic pain medication for severe pain not relieved by the Tylenol.  Monitor your Tylenol intake to a max of 4,000 mg in a 24 hour period. You can continue with your mobic.  Diet: 1. Low sodium Heart Healthy Diet is recommended but you are cleared to resume your normal (before surgery) diet after your procedure.  2. It is safe to use a laxative, such as Miralax or Colace, if you have difficulty moving your bowels. You have been prescribed Sennakot-S to take at bedtime every evening after surgery to keep bowel movements regular and to  prevent constipation.    Wound Care: 1. Keep clean and dry.  Shower daily.  Reasons to call the Doctor: Fever - Oral temperature greater than 100.4 degrees Fahrenheit Foul-smelling vaginal discharge Difficulty urinating Nausea and vomiting Increased pain at the site of the incision that is unrelieved with pain medicine. Difficulty breathing with or  without chest pain New calf pain especially if only on one side Sudden, continuing increased vaginal bleeding with or without clots.   Contacts: For questions or concerns you should contact:  Dr. Jeral Pinch at [phone]  Joylene John, NP at [phone]  After Hours: call [phone] and have the GYN Oncologist paged/contacted (after 5 pm or on the weekends). You will speak with an after hours RN and let he or she know you have had surgery.  Messages sent via mychart are for non-urgent matters and are not responded to after hours so for urgent needs, please call the after hours number.

## 2022-12-15 ENCOUNTER — Telehealth: Payer: Self-pay | Admitting: *Deleted

## 2022-12-15 ENCOUNTER — Telehealth: Payer: Self-pay | Admitting: Cardiology

## 2022-12-15 DIAGNOSIS — C541 Malignant neoplasm of endometrium: Secondary | ICD-10-CM | POA: Insufficient documentation

## 2022-12-15 NOTE — Patient Instructions (Addendum)
We will place a referral to GI for a screening colonoscopy. You should receive a phone call with this appointment.   We will also reach out to your cardiologist to obtain risk stratification and optimization for surgery.    Preparing for your Surgery   Plan for surgery on January 04, 2023 with Dr. Jeral Pinch at [facility] will be scheduled for robotic assisted total laparoscopic hysterectomy (removal of the uterus and cervix), bilateral salpingo-oophorectomy (removal of both ovaries and fallopian tubes), sentinel lymph node biopsy, possible lymph node dissection, possible laparotomy (larger incision on your abdomen if needed).    Pre-operative Testing -You will receive a phone call from presurgical testing at [facility] to arrange for a pre-operative appointment and lab work.   -Bring your insurance card, copy of an advanced directive if applicable, medication list   -At that visit, you will be asked to sign a consent for a possible blood transfusion in case a transfusion becomes necessary during surgery.  The need for a blood transfusion is rare but having consent is a necessary part of your care.      -We will let you know when to stop taking your plavix before surgery.   -Do not take supplements such as fish oil (omega 3), red yeast rice, turmeric before your surgery. You want to avoid medications with aspirin in them including headache powders such as BC or Goody's), Excedrin migraine.   YOU WILL NEED TO STOP TAKING MOBIC (MELOXICAM) AT LEAST 7 DAYS BEFORE SURGERY.   Day Before Surgery at [place] will be asked to take in a light diet the day before surgery. You will be advised you can have clear liquids up until 3 hours before your surgery.     Eat a light diet the day before surgery.  Examples including soups, broths, toast, yogurt, mashed potatoes.  AVOID GAS PRODUCING FOODS AND BEVERAGES. Things to avoid include carbonated beverages (fizzy beverages,  sodas), raw fruits and raw vegetables (uncooked), or beans.    If your bowels are filled with gas, your surgeon will have difficulty visualizing your pelvic organs which increases your surgical risks.   Your role in recovery Your role is to become active as soon as directed by your doctor, while still giving yourself time to heal.  Rest when you feel tired. You will be asked to do the following in order to speed your recovery:   - Cough and breathe deeply. This helps to clear and expand your lungs and can prevent pneumonia after surgery.  - [place]. Do mild physical activity. Walking or moving your legs help your circulation and body functions return to normal. Do not try to get up or walk alone the first time after surgery.   -If you develop swelling on one leg or the other, pain in the back of your leg, redness/warmth in one of your legs, please call the office or go to the Emergency Room to have a doppler to rule out a blood clot. For shortness of breath, chest pain-seek care in the Emergency Room as soon as possible. - Actively manage your pain. Managing your pain lets you move in comfort. We will ask you to rate your pain on a scale of zero to 10. It is your responsibility to tell your doctor or nurse where and how much you hurt so your pain can be treated.   Special Considerations -If you are diabetic, you may be placed on  insulin after surgery to have closer control over your blood sugars to promote healing and recovery.  This does not mean that you will be discharged on insulin.  If applicable, your oral antidiabetics will be resumed when you are tolerating a solid diet.   -Your final pathology results from surgery should be available around one week after surgery and the results will be relayed to you when available.   -FMLA forms can be faxed to [phone] and please allow 5-7 business days for completion.   Pain Management After Surgery -You have been prescribed  your pain medication and bowel regimen medications before surgery so that you can have these available when you are discharged from the hospital. The pain medication is for use ONLY AFTER surgery and a new prescription will not be given.    -Make sure that you have Tylenol IF YOU ARE ABLE TO TAKE THESE MEDICATION at home to use on a regular basis after surgery for pain control. You can continue use of mobic.   -Review the attached handout on narcotic use and their risks and side effects.    Bowel Regimen -You have been prescribed Sennakot-S to take nightly to prevent constipation especially if you are taking the narcotic pain medication intermittently.  It is important to prevent constipation and drink adequate amounts of liquids. You can stop taking this medication when you are not taking pain medication and you are back on your normal bowel routine.   Risks of Surgery Risks of surgery are low but include bleeding, infection, damage to surrounding structures, re-operation, blood clots, and very rarely death.     Blood Transfusion Information (For the consent to be signed before surgery)   We will be checking your blood type before surgery so in case of emergencies, we will know what type of blood you would need.                                             WHAT IS A BLOOD TRANSFUSION?   A transfusion is the replacement of blood or some of its parts. Blood is made up of multiple cells which provide different functions. Red blood cells carry oxygen and are used for blood loss replacement. White blood cells fight against infection. Platelets control bleeding. Plasma helps clot blood. Other blood products are available for specialized needs, such as hemophilia or other clotting disorders. BEFORE THE TRANSFUSION  Who gives blood for transfusions?  You may be able to donate blood to be used at a later date on yourself (autologous donation). Relatives can be asked to donate blood. This is  generally not any safer than if you have received blood from a stranger. The same precautions are taken to ensure safety when a relative's blood is donated. Healthy volunteers who are fully evaluated to make sure their blood is safe. This is blood bank blood. Transfusion therapy is the safest it has ever been in the practice of medicine. Before blood is taken from a donor, a complete history is taken to make sure that person has no history of diseases nor engages in risky social behavior (examples are intravenous drug use or sexual activity with multiple partners). The donor's travel history is screened to minimize risk of transmitting infections, such as malaria. The donated blood is tested for signs of infectious diseases, such as HIV and hepatitis. The blood is then  tested to be sure it is compatible with you in order to minimize the chance of a transfusion reaction. If you or a relative donates blood, this is often done in anticipation of surgery and is not appropriate for emergency situations. It takes many days to process the donated blood. RISKS AND COMPLICATIONS Although transfusion therapy is very safe and saves many lives, the main dangers of transfusion include:  Getting an infectious disease. Developing a transfusion reaction. This is an allergic reaction to something in the blood you were given. Every precaution is taken to prevent this. The decision to have a blood transfusion has been considered carefully by your caregiver before blood is given. Blood is not given unless the benefits outweigh the risks.   AFTER SURGERY INSTRUCTIONS   Return to work: 4-6 weeks if applicable   Activity: 1. Be up and out of the bed during the day.  Take a nap if needed.  You may walk up steps but be careful and use the hand rail.  Stair climbing will tire you more than you think, you may need to stop part way and rest.    2. No lifting or straining for 6 weeks over 10 pounds. No pushing, pulling,  straining for 6 weeks.   3. No driving for around 1 week(s).  Do not drive if you are taking narcotic pain medicine and make sure that your reaction time has returned.    4. You can shower as soon as the next day after surgery. Shower daily.  Use your regular soap and water (not directly on the incision) and pat your incision(s) dry afterwards; don't rub.  No tub baths or submerging your body in water until cleared by your surgeon. If you have the soap that was given to you by pre-surgical testing that was used before surgery, you do not need to use it afterwards because this can irritate your incisions.    5. No sexual activity and nothing in the vagina for 10-12 weeks.   6. You may experience a small amount of clear drainage from your incisions, which is normal.  If the drainage persists, increases, or changes color please call the office.   7. Do not use creams, lotions, or ointments such as neosporin on your incisions after surgery until advised by your surgeon because they can cause removal of the dermabond glue on your incisions.     8. You may experience vaginal spotting after surgery or around the 6-8 week mark from surgery when the stitches at the top of the vagina begin to dissolve.  The spotting is normal but if you experience heavy bleeding, call our office.   9. Take Tylenol first for pain if you are able to take these medication and only use narcotic pain medication for severe pain not relieved by the Tylenol.  Monitor your Tylenol intake to a max of 4,000 mg in a 24 hour period. You can continue with your mobic.   Diet: 1. Low sodium Heart Healthy Diet is recommended but you are cleared to resume your normal (before surgery) diet after your procedure.   2. It is safe to use a laxative, such as Miralax or Colace, if you have difficulty moving your bowels. You have been prescribed Sennakot-S to take at bedtime every evening after surgery to keep bowel movements regular and to prevent  constipation.     Wound Care: 1. Keep clean and dry.  Shower daily.   Reasons to call the Doctor: Fever -  Oral temperature greater than 100.4 degrees Fahrenheit Foul-smelling vaginal discharge Difficulty urinating Nausea and vomiting Increased pain at the site of the incision that is unrelieved with pain medicine. Difficulty breathing with or without chest pain New calf pain especially if only on one side Sudden, continuing increased vaginal bleeding with or without clots.   Contacts: For questions or concerns you should contact:   Dr. Jeral Pinch at [phone]   Joylene John, NP at [phone]   After Hours: call [phone] and have the GYN Oncologist paged/contacted (after 5 pm or on the weekends). You will speak with an after hours RN and let he or she know you have had surgery.   Messages sent via mychart are for non-urgent matters and are not responded to after hours so for urgent needs, please call the after hours number.

## 2022-12-15 NOTE — Telephone Encounter (Signed)
Per Dr Berline Lopes fax records and surgical optimization form to Cardiology

## 2022-12-15 NOTE — Progress Notes (Signed)
Patient here for new patient consultation with Dr. Jeral Pinch and for a pre-operative appointment prior to her scheduled surgery on January 04, 2023. She is scheduled for robotic assisted total laparoscopic hysterectomy, bilateral salpingo-oophorectomy, sentinel lymph node biopsy, possible lymph node dissection, possible laparotomy.  The surgery was discussed in detail.  See after visit summary for additional details. Visual aids used to discuss items related to surgery.      Discussed post-op pain management in detail including the aspects of the enhanced recovery pathway.  Advised her that a new prescription would be sent in for tramadol and it is only to be used for after her upcoming surgery.  We discussed the use of tylenol post-op and to monitor for a maximum of 4,000 mg in a 24 hour period.  Also prescribed sennakot to be used after surgery and to hold if having loose stools.  Discussed bowel regimen in detail.     Discussed the use of SCDs and measures to take at home to prevent DVT including frequent mobility.  Reportable signs and symptoms of DVT discussed. Post-operative instructions discussed and expectations for after surgery. Incisional care discussed as well including reportable signs and symptoms including erythema, drainage, wound separation.     10 minutes spent preparing information and with the patient.  Verbalizing understanding of material discussed. No needs or concerns voiced at the end of the visit.   Advised patient to call for any needs.  Advised that her post-operative medications had been prescribed and could be picked up at any time.    This appointment is included in the global surgical bundle as pre-operative teaching and has no charge.

## 2022-12-21 ENCOUNTER — Telehealth: Payer: Self-pay | Admitting: Licensed Clinical Social Worker

## 2022-12-21 NOTE — Telephone Encounter (Signed)
[facility] Clinical Social Work  Clinical Social Work was referred by new patient protocol for assessment of psychosocial needs.  Clinical Social Worker  attempted to contact patient by phone   to offer support and assess for needs.   No answer. Left VM with direct contact information.      Dabney Schanz E Nimo Verastegui, LCSW  Clinical Social Worker [facility]        

## 2022-12-22 ENCOUNTER — Telehealth: Payer: Self-pay | Admitting: Cardiology

## 2022-12-22 NOTE — Telephone Encounter (Signed)
Tara Adams is calling to follow up on pt's surgical clearance. Pt's surgery is scheduled for 04/18 and wanted to make sure we received it. See phone note 03/29, looks like a clearance was faxed over. Fax number [phone]

## 2022-12-22 NOTE — Telephone Encounter (Signed)
error 

## 2022-12-22 NOTE — Telephone Encounter (Signed)
I have reviewed the chart and I do not see that a clearance request has been received and entered into the chart. As far as see the phone note 12/15/22 where message states looks like clearance was faxed, there is nothing attached to the phone note. I s/w Pilar, CMA in our [place] office in regard fax that she sent on 12/15/22. Per Pilar, it needs to be resent if not entered we will look for it & enter it today when received fax [phone]. I asked if she would please reach out the surgeon's for a new request to be sent.  Currie Paris will get a new request and enter into the chart. Per surgeon's office pt's surgery is 01/04/23.

## 2022-12-22 NOTE — Patient Instructions (Signed)
SURGICAL WAITING ROOM VISITATION  Patients having surgery or a procedure may have no more than 2 support people in the waiting area - these visitors may rotate.    Children under the age of 72 must have an adult with them who is not the patient.  Due to an increase in RSV and influenza rates and associated hospitalizations, children ages 47 and under may not visit patients in [facility] hospitals.  If the patient needs to stay at the hospital during part of their recovery, the visitor guidelines for inpatient rooms apply. Pre-op nurse will coordinate an appropriate time for 1 support person to accompany patient in pre-op.  This support person may not rotate.    Please refer to the [facility] website for the visitor guidelines for Inpatients (after your surgery is over and you are in a regular room).    Your procedure is scheduled on: 01/04/23   Report to [facility] Main Entrance    Report to admitting at 7:15 AM   Call this number if you have problems the morning of surgery [phone]   Do not eat food :After Midnight.   After Midnight you may have the following liquids until 6:30 AM DAY OF SURGERY  Water Non-Citrus Juices (without pulp, NO RED-Apple, White grape, White cranberry) Black Coffee (NO MILK/CREAM OR CREAMERS, sugar ok)  Clear Tea (NO MILK/CREAM OR CREAMERS, sugar ok) regular and decaf                             Plain Jell-O (NO RED)                                           Fruit ices (not with fruit pulp, NO RED)                                     Popsicles (NO RED)                                                               Sports drinks like Gatorade (NO RED)          If you have questions, please contact your surgeon's office.   FOLLOW BOWEL PREP AND ANY ADDITIONAL PRE OP INSTRUCTIONS YOU RECEIVED FROM YOUR SURGEON'S OFFICE!!!     Oral Hygiene is also important to reduce your risk of infection.                                    Remember -  BRUSH YOUR TEETH THE MORNING OF SURGERY WITH YOUR REGULAR TOOTHPASTE  DENTURES WILL BE REMOVED PRIOR TO SURGERY PLEASE DO NOT APPLY "Poly grip" OR ADHESIVES!!!   Do NOT smoke after Midnight   Take these medicines the morning of surgery with A SIP OF WATER: Atorvastatin, Carvedilol, Gabapentin, Tramadol  DO NOT TAKE ANY ORAL DIABETIC MEDICATIONS DAY OF YOUR SURGERY  Bring CPAP mask and tubing day of surgery.  You may not have any metal on your body including hair pins, jewelry, and body piercing             Do not wear make-up, lotions, powders, perfumes, or deodorant  Do not wear nail polish including gel and S&S, artificial/acrylic nails, or any other type of covering on natural nails including finger and toenails. If you have artificial nails, gel coating, etc. that needs to be removed by a nail salon please have this removed prior to surgery or surgery may need to be canceled/ delayed if the surgeon/ anesthesia feels like they are unable to be safely monitored.   Do not shave  48 hours prior to surgery.    Do not bring valuables to the hospital. [facility] IS NOT             RESPONSIBLE   FOR VALUABLES.   Contacts, glasses, dentures or bridgework may not be worn into surgery.  DO NOT BRING YOUR HOME MEDICATIONS TO THE HOSPITAL. PHARMACY WILL DISPENSE MEDICATIONS LISTED ON YOUR MEDICATION LIST TO YOU DURING YOUR ADMISSION IN THE HOSPITAL!    Patients discharged on the day of surgery will not be allowed to drive home.  Someone NEEDS to stay with you for the first 24 hours after anesthesia.   Special Instructions: Bring a copy of your healthcare power of attorney and living will documents the day of surgery if you haven't scanned them before.              Please read over the following fact sheets you were given: IF YOU HAVE QUESTIONS ABOUT YOUR PRE-OP INSTRUCTIONS PLEASE CALL [phone] Contras    If you received a COVID test during your pre-op visit   it is requested that you wear a mask when out in public, stay away from anyone that may not be feeling well and notify your surgeon if you develop symptoms. If you test positive for Covid or have been in contact with anyone that has tested positive in the last 10 days please notify you surgeon.    [facility] - Preparing for Surgery Before surgery, you can play an important role.  Because skin is not sterile, your skin needs to be as free of germs as possible.  You can reduce the number of germs on your skin by washing with CHG (chlorahexidine gluconate) soap before surgery.  CHG is an antiseptic cleaner which kills germs and bonds with the skin to continue killing germs even after washing. Please DO NOT use if you have an allergy to CHG or antibacterial soaps.  If your skin becomes reddened/irritated stop using the CHG and inform your nurse when you arrive at Short Stay. Do not shave (including legs and underarms) for at least 48 hours prior to the first CHG shower.  You may shave your face/neck.  Please follow these instructions carefully:  1.  Shower with CHG Soap the night before surgery and the  morning of surgery.  2.  If you choose to wash your hair, wash your hair first as usual with your normal  shampoo.  3.  After you shampoo, rinse your hair and body thoroughly to remove the shampoo.                             4.  Use CHG as you would any other liquid soap.  You can apply chg directly to the skin and wash.  Gently with a  scrungie or clean washcloth.  5.  Apply the CHG Soap to your body ONLY FROM THE NECK DOWN.   Do   not use on face/ open                           Wound or open sores. Avoid contact with eyes, ears mouth and   genitals (private parts).                       Wash face,  Genitals (private parts) with your normal soap.             6.  Wash thoroughly, paying special attention to the area where your    surgery  will be performed.  7.  Thoroughly rinse your body with warm  water from the neck down.  8.  DO NOT shower/wash with your normal soap after using and rinsing off the CHG Soap.                9.  Pat yourself dry with a clean towel.            10.  Wear clean pajamas.            11.  Place clean sheets on your bed the night of your first shower and do not  sleep with pets. Day of Surgery : Do not apply any lotions/deodorants the morning of surgery.  Please wear clean clothes to the hospital/surgery center.  FAILURE TO FOLLOW THESE INSTRUCTIONS MAY RESULT IN THE CANCELLATION OF YOUR SURGERY  PATIENT SIGNATURE_________________________________  NURSE SIGNATURE__________________________________  ________________________________________________________________________  Rogelia MireIncentive Spirometer  An incentive spirometer is a tool that can help keep your lungs clear and active. This tool measures how well you are filling your lungs with each breath. Taking long deep breaths may help reverse or decrease the chance of developing breathing (pulmonary) problems (especially infection) following: A long period of time when you are unable to move or be active. BEFORE THE PROCEDURE  If the spirometer includes an indicator to show your best effort, your nurse or respiratory therapist will set it to a desired goal. If possible, sit up straight or lean slightly forward. Try not to slouch. Hold the incentive spirometer in an upright position. INSTRUCTIONS FOR USE  Sit on the edge of your bed if possible, or sit up as far as you can in bed or on a chair. Hold the incentive spirometer in an upright position. Breathe out normally. Place the mouthpiece in your mouth and seal your lips tightly around it. Breathe in slowly and as deeply as possible, raising the piston or the ball toward the top of the column. Hold your breath for 3-5 seconds or for as long as possible. Allow the piston or ball to fall to the bottom of the column. Remove the mouthpiece from your mouth and  breathe out normally. Rest for a few seconds and repeat Steps 1 through 7 at least 10 times every 1-2 hours when you are awake. Take your time and take a few normal breaths between deep breaths. The spirometer may include an indicator to show your best effort. Use the indicator as a goal to work toward during each repetition. After each set of 10 deep breaths, practice coughing to be sure your lungs are clear. If you have an incision (the cut made at the time of surgery), support your incision when coughing by placing a pillow or  rolled up towels firmly against it. Once you are able to get out of bed, walk around indoors and cough well. You may stop using the incentive spirometer when instructed by your caregiver.  RISKS AND COMPLICATIONS Take your time so you do not get dizzy or light-headed. If you are in pain, you may need to take or ask for pain medication before doing incentive spirometry. It is harder to take a deep breath if you are having pain. AFTER USE Rest and breathe slowly and easily. It can be helpful to keep track of a log of your progress. Your caregiver can provide you with a simple table to help with this. If you are using the spirometer at home, follow these instructions: SEEK MEDICAL CARE IF:  You are having difficultly using the spirometer. You have trouble using the spirometer as often as instructed. Your pain medication is not giving enough relief while using the spirometer. You develop fever of 100.5 F (38.1 C) or higher. SEEK IMMEDIATE MEDICAL CARE IF:  You cough up bloody sputum that had not been present before. You develop fever of 102 F (38.9 C) or greater. You develop worsening pain at or near the incision site. MAKE SURE YOU:  Understand these instructions. Will watch your condition. Will get help right away if you are not doing well or get worse. Document Released: 01/15/2007 Document Revised: 11/27/2011 Document Reviewed: 03/18/2007 Exit[employer] Patient  Information 2014 [employer], [place].   ________________________________________________________________________ WHAT IS A BLOOD TRANSFUSION? Blood Transfusion Information  A transfusion is the replacement of blood or some of its parts. Blood is made up of multiple cells which provide different functions. Red blood cells carry oxygen and are used for blood loss replacement. White blood cells fight against infection. Platelets control bleeding. Plasma helps clot blood. Other blood products are available for specialized needs, such as hemophilia or other clotting disorders. BEFORE THE TRANSFUSION  Who gives blood for transfusions?  Healthy volunteers who are fully evaluated to make sure their blood is safe. This is blood bank blood. Transfusion therapy is the safest it has ever been in the practice of medicine. Before blood is taken from a donor, a complete history is taken to make sure that person has no history of diseases nor engages in risky social behavior (examples are intravenous drug use or sexual activity with multiple partners). The donor's travel history is screened to minimize risk of transmitting infections, such as malaria. The donated blood is tested for signs of infectious diseases, such as HIV and hepatitis. The blood is then tested to be sure it is compatible with you in order to minimize the chance of a transfusion reaction. If you or a relative donates blood, this is often done in anticipation of surgery and is not appropriate for emergency situations. It takes many days to process the donated blood. RISKS AND COMPLICATIONS Although transfusion therapy is very safe and saves many lives, the main dangers of transfusion include:  Getting an infectious disease. Developing a transfusion reaction. This is an allergic reaction to something in the blood you were given. Every precaution is taken to prevent this. The decision to have a blood transfusion has been considered carefully by your  caregiver before blood is given. Blood is not given unless the benefits outweigh the risks. AFTER THE TRANSFUSION Right after receiving a blood transfusion, you will usually feel much better and more energetic. This is especially true if your red blood cells have gotten low (anemic). The transfusion raises the level  of the red blood cells which carry oxygen, and this usually causes an energy increase. The nurse administering the transfusion will monitor you carefully for complications. HOME CARE INSTRUCTIONS  No special instructions are needed after a transfusion. You may find your energy is better. Speak with your caregiver about any limitations on activity for underlying diseases you may have. SEEK MEDICAL CARE IF:  Your condition is not improving after your transfusion. You develop redness or irritation at the intravenous (IV) site. SEEK IMMEDIATE MEDICAL CARE IF:  Any of the following symptoms occur over the next 12 hours: Shaking chills. You have a temperature by mouth above 102 F (38.9 C), not controlled by medicine. Chest, back, or muscle pain. People around you feel you are not acting correctly or are confused. Shortness of breath or difficulty breathing. Dizziness and fainting. You get a rash or develop hives. You have a decrease in urine output. Your urine turns a dark color or changes to pink, red, or brown. Any of the following symptoms occur over the next 10 days: You have a temperature by mouth above 102 F (38.9 C), not controlled by medicine. Shortness of breath. Weakness after normal activity. The white part of the eye turns yellow (jaundice). You have a decrease in the amount of urine or are urinating less often. Your urine turns a dark color or changes to pink, red, or brown. Document Released: 09/01/2000 Document Revised: 11/27/2011 Document Reviewed: 04/20/2008 [facility] Patient Information 2014 [place],  [place].  _______________________________________________________________________

## 2022-12-22 NOTE — Progress Notes (Signed)
COVID Vaccine Completed:  Date of COVID positive in last 90 days:  PCP - Darreld Mclean, MD Cardiologist - Debbe Odea, MD  Chest x-ray -  EKG -  Stress Test - 04/12/21 Epic ECHO - 05/12/21 Epic Cardiac Cath - 05/20/21 Epic Pacemaker/ICD device last checked: Spinal Cord Stimulator:  Bowel Prep - light diet day before  Sleep Study -  CPAP -   Fasting Blood Sugar -  Checks Blood Sugar _____ times a day  Last dose of GLP1 agonist-  N/A GLP1 instructions:  N/A   Last dose of SGLT-2 inhibitors-  N/A SGLT-2 instructions: N/A   Blood Thinner Instructions: Aspirin Instructions: ASA 81 Last Dose:  Activity level:  Can go up a flight of stairs and perform activities of daily living without stopping and without symptoms of chest pain or shortness of breath.  Able to exercise without symptoms  Unable to go up a flight of stairs without symptoms of     Anesthesia review: CAD, HTN, cardiac clearance?  Patient denies shortness of breath, fever, cough and chest pain at PAT appointment  Patient verbalized understanding of instructions that were given to them at the PAT appointment. Patient was also instructed that they will need to review over the PAT instructions again at home before surgery.

## 2022-12-25 ENCOUNTER — Encounter: Payer: Self-pay | Admitting: Cardiology

## 2022-12-25 ENCOUNTER — Encounter (HOSPITAL_COMMUNITY)
Admission: RE | Admit: 2022-12-25 | Discharge: 2022-12-25 | Disposition: A | Discharge status: Home / Self Care | Payer: Medicare HMO | Source: Ambulatory Visit | Attending: Anesthesiology | Admitting: Anesthesiology

## 2022-12-25 ENCOUNTER — Telehealth: Payer: Self-pay | Admitting: Cardiology

## 2022-12-25 DIAGNOSIS — I25118 Atherosclerotic heart disease of native coronary artery with other forms of angina pectoris: Secondary | ICD-10-CM

## 2022-12-25 NOTE — Telephone Encounter (Signed)
Left message for the pt that she is going to need an in office appt for pre op clearance. I was hoping pt answered as so that I may offered the appt 12/27/22 @ 1:55 with Frutoso Schatz, NP. Left message to call back ASAP as not sure if appt will still be available when she does call back.

## 2022-12-25 NOTE — Telephone Encounter (Signed)
   Pre-operative Risk Assessment    Patient Name: Tara Adams  DOB: Jun 15, 1951 MRN: [id number]      Request for Surgical Clearance    Procedure:  Diagnosis: Endometrial Cancer Robotic assisted total laparoscopic hysterectomy, bilateral salpingo-oophorectomy, sentinel lymph node biopsy, possible lymph node dissection, possible laparotomy  Date of Surgery:  Clearance 01/04/23                                 Surgeon:  Eugene Garnet Surgeon's Group or Practice Name:  [facility] Phone number:  [phone] Fax number:  [phone]   Type of Clearance Requested:  Both     Type of Anesthesia:  General    Additional requests/questions:    Signed, Narda Amber   12/25/2022, 7:36 AM

## 2022-12-25 NOTE — Telephone Encounter (Signed)
    Primary Cardiologist:Brian Agbor-Etang, MD  Chart reviewed as part of pre-operative protocol coverage. Because of Tara Adams's past medical history and time since last visit, he/she will require a follow-up Office visit in order to better assess preoperative cardiovascular risk.  Pre-op covering staff: - Please schedule appointment and call patient to inform them. - Please contact requesting surgeon's office via preferred method (i.e, phone, fax) to inform them of need for appointment prior to surgery.  If applicable, this message will also be routed to pharmacy pool and/or primary cardiologist for input on holding anticoagulant/antiplatelet agent as requested below so that this information is available at time of patient's appointment.   Tara Asters, NP  12/25/2022, 8:16 AM

## 2022-12-25 NOTE — Telephone Encounter (Signed)
Patient returned call and was scheduled for 04/10 at 8:50 am with Eligha Bridegroom NP at [facility] office due to the appt originally offered on VM no longer being available.

## 2022-12-26 NOTE — Progress Notes (Addendum)
Cardiology Office Note:    Date:  12/27/2022   ID:  Tara Adams, DOB 06/13/1951, MRN [id number]  PCP:  Leanna SatoMiles, Linda M, MD   [facility] Providers Cardiologist:  Debbe OdeaBrian Agbor-Etang, MD     Referring MD: Leanna SatoMiles, Linda M, MD   Chief Complaint: Preoperative cardiac evaluation  History of Present Illness:    Tara Adams is a very pleasant 72 y.o. female with a hx of CAD s/p CABG x 2 in 2002 (LIMA-LAD, SVG-LCx), carotid artery stenosis, HTN, HLD, and obesity. Recent diagnosis of endometrial cancer stage II.   Left heart catheterization 05/2021 revealed significant three-vessel native disease (CTO LAD, LCx, RCA) with patent LIMA to LAD, SVG to left circumflex 90% stenosis at the distal insertion site, medical management recommended.  Anginal symptoms improved with Imdur. Did not tolerate DAPT due to vaginal bleeding. Had resolution of bleeding with stopping Plavix.   Seen by vascular surgery 09/02/2021 for bilateral carotid artery stenosis.  She had symptomatic left-sided internal carotid artery stenosis with 60 to 79% stenosis and was scheduled for left-sided carotid endarterectomy. She decided to pursue a 2nd opinion.   Last cardiology clinic visit was 11/28/2021 with Dr. Azucena CecilAgbor-Etang at which time she was following up for recent adjustment of blood pressure medications.  She reported leg edema.  She was advised to elevate legs and report if symptoms persisted.  She was continued on carvedilol 25 mg twice daily, amlodipine 10 mg daily, Maxide, and Imdur 60 mg daily.  She called back a few days later to report persistent leg edema. Amlodipine was stopped and she was started on losartan 50 mg daily. Losartan was later increased to 100 mg for persistently elevated BP.   Today, she is here for preoperative cardiac evaluation for upcoming laparoscopic hysterectomy, bilateral salpingo oophorectomy, possible laparatomy for endometrial cancer. 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  Continues to have chest pain consistent with previous angina that occurs with exertion. Occurs sometimes on a daily basis, sometimes less frequently. Keeps sublingual nitroglycerin on hand and typically takes 1 with resolution of pain.  Has also tested whether pain would improve with just rest which it typically does. Walking in from parking lot today, had to park far away. Was able to come do so without taking ntg but had to slow down her pace. Asked about follow-up of carotid artery disease, states she did not feel comfortable with the provider that she saw. Is seeking a second opinion through PCP. Also felt that she was not treated well at the time of cardiac cath. Wants to [place] care from [place], [place].  Intentional 10 lb weight loss recently. She denies shortness of breath, lower extremity edema, fatigue, palpitations, melena, hematuria, hemoptysis, diaphoresis, weakness, presyncope, syncope, orthopnea, and PND.   Past Medical History:  Diagnosis Date   Cancer    andometrial   Carotid artery occlusion    Coronary artery disease    a. ~ 2002 s/p CABG x 2 ([facility]); b. 03/2021 MV: Inferior, inferolateral, mid and apical anterior ischemia. EF 41%. High risk study.   Headache    migraines   Hyperlipidemia    Hypertension    Morbid obesity    Sciatica     Past Surgical History:  Procedure Laterality Date   BREAST BIOPSY Left 11/09/2022   pathology - no cancer   BREAST BIOPSY Left 11/09/2022   US LT BREAST BX W LOC DEV 1ST LESION IMG BX SPEC US GUIDE 11/09/2022 [facility]  MAMMOGRAPHY   CATARACT EXTRACTION Bilateral 10/2022   CESAREAN SECTION     CORONARY ARTERY BYPASS GRAFT N/A    LEFT HEART CATH AND CORS/GRAFTS ANGIOGRAPHY N/A 05/20/2021   Procedure: LEFT HEART CATH AND CORS/GRAFTS ANGIOGRAPHY;  Surgeon: Iran Ouch, MD;  Location: [facility] INVASIVE CV LAB;  Service: Cardiovascular;  Laterality: N/A;    Current Medications: Current Meds  Medication  Sig   Ascorbic Acid (VITAMIN C) 1000 MG tablet Take 1,000 mg by mouth daily.   aspirin EC 81 MG tablet Take 81 mg by mouth daily. Swallow whole.   atorvastatin (LIPITOR) 80 MG tablet Take 1 tablet (80 mg total) by mouth daily.   butalbital-acetaminophen-caffeine (FIORICET) 50-325-40 MG tablet Take 1 tablet by mouth as needed for headache or migraine.   CALCIUM-VITAMIN D PO Take 1 tablet by mouth daily.   gabapentin (NEURONTIN) 100 MG capsule Take 100 mg by mouth as needed (pain).   hydrocortisone cream 0.5 % Apply 1 application topically as needed for itching (Eczema).   ibuprofen (ADVIL) 200 MG tablet Take 400-600 mg by mouth as needed for mild pain.   losartan (COZAAR) 100 MG tablet Take 1 tablet (100 mg total) by mouth daily.   Multiple Vitamin (MULTIVITAMIN WITH MINERALS) TABS tablet Take 1 tablet by mouth daily.   Multiple Vitamins-Minerals (HAIR/SKIN/NAILS/BIOTIN PO) Take 2 tablets by mouth daily.   nitroGLYCERIN (NITROSTAT) 0.4 MG SL tablet Place 0.4 mg under the tongue every 5 (five) minutes as needed for chest pain.   senna-docusate (SENOKOT-S) 8.6-50 MG tablet Take 2 tablets by mouth at bedtime. For AFTER surgery, do not take if having diarrhea (Patient taking differently: Take 2 tablets by mouth as needed for mild constipation or moderate constipation.)   tiZANidine (ZANAFLEX) 4 MG tablet Take 4 mg by mouth as needed for muscle spasms.   traMADol (ULTRAM) 50 MG tablet Take 1 tablet (50 mg total) by mouth every 6 (six) hours as needed for severe pain. For AFTER surgery, do not take and drive (Patient not taking: Reported on 12/27/2022)   triamterene-hydrochlorothiazide (MAXZIDE) 75-50 MG tablet Take 1 tablet by mouth daily.   vitamin B-12 (CYANOCOBALAMIN) 1000 MCG tablet Take 1,000 mcg by mouth daily.     Allergies:   Codeine   Social History   Socioeconomic History   Marital status: Single    Spouse name: Not on file   Number of children: Not on file   Years of education: Not on  file   Highest education level: Not on file  Occupational History   Occupation: works at [employer] at [employer]  Tobacco Use   Smoking status: Former    Types: Cigarettes    Quit date: 05/29/1981    Years since quitting: 41.6   Smokeless tobacco: Never  Vaping Use   Vaping Use: Never used  Substance and Sexual Activity   Alcohol use: Never   Drug use: Never   Sexual activity: Yes  Other Topics Concern   Not on file  Social History Narrative   Lives in [place].  Volunteers, driving elderly individuals.  Does not routinely exercise.   Social Determinants of Health   Financial Resource Strain: Not on file  Food Insecurity: No Food Insecurity (12/07/2022)   Hunger Vital Sign    Worried About Running Out of Food in the Last Year: Never true    Ran Out of Food in the Last Year: Never true  Transportation Needs: No Transportation Needs (12/07/2022)   PRAPARE - Transportation  Lack of Transportation (Medical): No    Lack of Transportation (Non-Medical): No  Physical Activity: Not on file  Stress: Not on file  Social Connections: Not on file     Family History: The patient's family history includes Breast cancer in her maternal aunt. There is no history of Colon cancer, Ovarian cancer, Endometrial cancer, Pancreatic cancer, or Prostate cancer.  ROS:   Please see the history of present illness.    + angina All other systems reviewed and are negative.  Labs/Other Studies Reviewed:    The following studies were reviewed today:  LHC 05/20/2021   Prox RCA to Mid RCA lesion is 100% stenosed.   Ost Cx to Prox Cx lesion is 100% stenosed.   Prox LAD to Mid LAD lesion is 100% stenosed.   Dist SVG to LCx to Insertion lesion is 95% stenosed.   Dist LAD lesion is 80% stenosed.   Conclusion: Severe native three-vessel coronary artery disease with chronic total occlusion of the LAD, left circumflex, and RCA. Patent LIMA to LAD that supplies collaterals to the RCA system. SVG to left  circumflex has a 90% stenosis at the distal insertion site supplying a diffusely diseased circumflex vessel. Overall, small territory. The RCA is unbypassed and chronically occluded supplied by left-to-right collaterals.   Recommendations: Medical management of angina.  The benefit of revascularizing the distal SVG graft is minimal and is high risk for occlusion of the branch vessels. Aspirin 81 mg indefinitely. Aggressive secondary prevention.  Echo 05/12/2021 1. Left ventricular ejection fraction, by estimation, is 50 to 55%. The  left ventricle has low normal function. The left ventricle has no regional  wall motion abnormalities. Left ventricular diastolic parameters are  consistent with Grade I diastolic  dysfunction (impaired relaxation).   2. Right ventricular systolic function is normal. The right ventricular  size is normal.   3. The mitral valve is normal in structure. Mild mitral valve  regurgitation.   4. The aortic valve is tricuspid. Aortic valve regurgitation is not  visualized. Mild to moderate aortic valve sclerosis/calcification is  present, without any evidence of aortic stenosis.   5. The inferior vena cava is normal in size with greater than 50%  respiratory variability, suggesting right atrial pressure of 3 mmHg.   CTA Neck 07/20/2021 Atherosclerotic disease at both carotid bifurcations. No stenosis on the right. 67% stenosis of the proximal ICA on the left due to soft and calcified plaque.   Atherosclerotic calcification in both carotid siphon regions but without stenosis greater than 30%.   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 Beyond that, the right vertebral artery is patent to the basilar. The basilar artery shows serial 30% stenoses. Posterior circulation branch vessels do show flow. There are patent bilateral posterior communicating arteries, significantly protective in this  situation.  Carotid Duplex 05/13/2021 Summary:  Right Carotid: Velocities in the right ICA are consistent with a 1-39%  stenosis.                Non-hemodynamically significant plaque <50% noted in the  CCA. The                 ECA appears <50% stenosed.   Left Carotid: Velocities in the left ICA are consistent with a 60-79%  stenosis.               Non-hemodynamically significant plaque <50% noted in the  CCA. The  ECA appears <50% stenosed.   Vertebrals:  Bilateral vertebral arteries demonstrate antegrade flow.  Subclavians: Normal flow hemodynamics were seen in bilateral subclavian               arteries.   *See table(s) above for measurements and observations.  Suggest follow up study in 12 months.    Consider additional imaging modality in regard to proximal left ICA  stenosis  Recent Labs: 12/27/2022: ALT 14; BUN 24; Creatinine, Ser 1.23; Hemoglobin 10.1; Platelets 223; Potassium 3.9; Sodium 141  Recent Lipid Panel    Component Value Date/Time   CHOL 179 01/02/2022 0933   TRIG 182 (H) 01/02/2022 0933   HDL 52 01/02/2022 0933   CHOLHDL 3.4 01/02/2022 0933   LDLCALC 96 01/02/2022 0933     Risk Assessment/Calculations:      STOP-Bang Score:          Physical Exam:    VS:  BP 130/72   Pulse 72   Ht 5\' 4"  (1.626 m)   Wt 207 lb 9.6 oz (94.2 kg)   SpO2 99%   BMI 35.63 kg/m     Wt Readings from Last 3 Encounters:  12/27/22 207 lb (93.9 kg)  12/27/22 207 lb 9.6 oz (94.2 kg)  12/14/22 207 lb (93.9 kg)     GEN: Obese, well developed in no acute distress. Appears younger than stated age. HEENT: Normal NECK: No JVD; No carotid bruits CARDIAC: RRR, no murmurs, rubs, gallops RESPIRATORY:  Clear to auscultation without rales, wheezing or rhonchi  ABDOMEN: Soft, non-tender, non-distended MUSCULOSKELETAL:  No edema; No deformity. 2+ pedal pulses, equal bilaterally SKIN: Warm and dry NEUROLOGIC:  Alert and oriented x 3 PSYCHIATRIC:  Normal affect    EKG:  EKG is ordered today.  The ekg ordered today demonstrates normal sinus rhythm at 63 bpm, nonspecific T wave abnormality       Diagnoses:    1. Coronary artery disease involving native coronary artery of native heart with unstable angina pectoris   2. Primary hypertension   3. Preoperative cardiovascular examination   4. Bilateral carotid artery stenosis   5. Hyperlipidemia LDL goal <70    Assessment and Plan:     Preoperative cardiac evaluation: She is able to achieve > 4 METS activity on a consistent basis, but continues to have angina. We will get a lexiscan myoview for evaluation of ischemia. Risk for MACE is 6.6 secondary to history of cerebrovascular disease and ischemic heart disease.  I have encouraged her to seek clearance from vascular surgery for stenosis in left carotid artery as well. Will await nuclear stress test results for further recommendations. Addendum: Low risk nuclear stress test with no evidence of ischemia or infarction on 01/02/2023.  She may proceed to surgery with no further cardiac testing required and with moderate cardiac risk as noted above.   CAD with unstable angina: s/p CABG x 2 in 2002. Recommendation following cardiac cath 05/2021, chronic occlusion of non-bypassed RCA with collaterals, medical management of angina. She is having increased episodes of angina that are relieved with nitroglycerin and/or rest. Symptoms occur at least several times per week. Symptoms do no occur at rest. No associated shortness of breath, n/v, or diaphoresis. We will increase her isosorbide to 90 mg daily. Need to ensure that LDL is at goal of < 70.  [facility] for evaluation of worsening ischemia. Continue aspirin, atorvastatin, carvedilol, Imdur, losartan.   Carotid artery stenosis: Carotid duplex 04/2021 revealed 60 to 79% blockage  in left ICA, right ICA with less than 40% stenosis. She was referred to vascular surgery and underwent CTA neck which revealed  67% stenosis of proximal ICA on the left due to soft and calcified plaque. She also has occlusions of the vertebral arteries. Did not feel that vascular surgery consult provided her with evidence that surgery is indicated. Encouraged her to follow-up with vascular surgery regarding significant stenosis in left carotid. Continue aspirin, atorvastatin.   Hypertension: BP initially elevated, improved on my recheck.  She reports no recent concerns with elevated BP. We are increasing Imdur to 90 mg daily.   Hyperlipidemia LDL goal < 55: LDL 96 on 12/2021. We will recheck today. Continue atorvastatin.   Shared Decision Making/Informed Consent The risks [chest pain, shortness of breath, cardiac arrhythmias, dizziness, blood pressure fluctuations, myocardial infarction, stroke/transient ischemic attack, nausea, vomiting, allergic reaction, radiation exposure, metallic taste sensation and life-threatening complications (estimated to be 1 in 10,000)], benefits (risk stratification, diagnosing coronary artery disease, treatment guidance) and alternatives of a nuclear stress test were discussed in detail with Tara Adams and she agrees to proceed.   Disposition: TBD - has requested transfer to Dr. Servando Salina from Dr. Azucena Cecil  Medication Adjustments/Labs and Tests Ordered: Current medicines are reviewed at length with the patient today.  Concerns regarding medicines are outlined above.  Orders Placed This Encounter  Procedures   Lipid Profile   Cardiac Stress Test: Informed Consent Details: Physician/Practitioner Attestation; Transcribe to consent form and obtain patient signature   Myocardial Perfusion Imaging   EKG 12-Lead   Meds ordered this encounter  Medications   DISCONTD: isosorbide mononitrate (IMDUR) 60 MG 24 hr tablet    Sig: Take 1.5 tablets (90 mg total) by mouth daily.    Dispense:  135 tablet    Refill:  3   isosorbide mononitrate (IMDUR) 60 MG 24 hr tablet    Sig: Take 1.5 tablets (90 mg  total) by mouth daily.    Dispense:  135 tablet    Refill:  3    Patient Instructions  Medication Instructions:   INCREASE Imdur one and one half (1.5) tablet by mouth (90 mg) daily.  *If you need a refill on your cardiac medications before your next appointment, please call your pharmacy*   Lab Work:  TODAY!!!! LIPID  If you have labs (blood work) drawn today and your tests are completely normal, you will receive your results only by: MyChart Message (if you have MyChart) OR A paper copy in the mail If you have any lab test that is abnormal or we need to change your treatment, we will call you to review the results.   Testing/Procedures:  You are scheduled for a Myocardial Perfusion Imaging Study on Tuesday, April 16 at 10:00 am.   Please arrive 15 minutes prior to your appointment time for registration and insurance purposes.   The test will take approximately 3 to 4 hours to complete; you may bring reading material. If someone comes with you to your appointment, they will need to remain in the main lobby due to limited space in the testing area.   How to prepare for your Myocardial Perfusion test:   Do not eat or drink 3 hours prior to your test, except you may have water.    Do not consume products containing caffeine (regular or decaffeinated) 12 hours prior to your test (ex: coffee, chocolate, soda, tea)   Do bring a list of your current medications with you. If not listed below,  you may take your medications as normal.               Hold Maxzide am of test.   Bring any held medication to your appointment, as you may be required to take it once the test is complete.   Do wear comfortable clothes (no dresses or overalls) and walking shoes. Tennis shoes are preferred. No heels or open toed shoes.  Do not wear cologne, perfume, or lotions (deodorant is allowed).   If these instructions are not followed, you test will have to be rescheduled.   Please report to [address] for your test. If you have questions or concerns about your appointment, please call the Nuclear Lab at #[phone].  If you cannot keep your appointment, please provide 24 hour notification to the Nuclear lab to avoid a possible $50 charge to your account.       Follow-Up: At [facility], you and your health needs are our priority.  As part of our continuing mission to provide you with exceptional heart care, we have created designated Provider Care Teams.  These Care Teams include your primary Cardiologist (physician) and Advanced Practice Providers (APPs -  Physician Assistants and Nurse Practitioners) who all work together to provide you with the care you need, when you need it.  We recommend signing up for the patient portal called "MyChart".  Sign up information is provided on this After Visit Summary.  MyChart is used to connect with patients for Virtual Visits (Telemedicine).  Patients are able to view lab/test results, encounter notes, upcoming appointments, etc.  Non-urgent messages can be sent to your provider as well.   To learn more about what you can do with MyChart, go to [email or web address].    Your next appointment:   As needed      Provider:   Thomasene Ripple,  MD   Other Instructions  Pt is switching providers.     Signed, Levi Aland, NP  12/27/2022 12:24 PM    [facility]

## 2022-12-27 ENCOUNTER — Telehealth: Payer: Self-pay

## 2022-12-27 ENCOUNTER — Encounter: Payer: Self-pay | Admitting: Nurse Practitioner

## 2022-12-27 ENCOUNTER — Ambulatory Visit (INDEPENDENT_AMBULATORY_CARE_PROVIDER_SITE_OTHER): Payer: Medicare HMO | Admitting: Nurse Practitioner

## 2022-12-27 ENCOUNTER — Telehealth: Payer: Self-pay | Admitting: Nurse Practitioner

## 2022-12-27 ENCOUNTER — Encounter
Admission: RE | Admit: 2022-12-27 | Discharge: 2022-12-27 | Disposition: A | Discharge status: Home / Self Care | Payer: Medicare HMO | Source: Ambulatory Visit | Attending: Nurse Practitioner | Admitting: Nurse Practitioner

## 2022-12-27 ENCOUNTER — Other Ambulatory Visit: Payer: Self-pay

## 2022-12-27 ENCOUNTER — Encounter (HOSPITAL_COMMUNITY): Payer: Self-pay

## 2022-12-27 VITALS — BP 130/72 | HR 72 | Ht 64.0 in | Wt 207.6 lb

## 2022-12-27 VITALS — BP 142/90 | HR 62 | Temp 98.5°F | Resp 16 | Ht 64.0 in | Wt 207.0 lb

## 2022-12-27 DIAGNOSIS — I10 Essential (primary) hypertension: Secondary | ICD-10-CM | POA: Insufficient documentation

## 2022-12-27 DIAGNOSIS — Z01818 Encounter for other preprocedural examination: Secondary | ICD-10-CM | POA: Diagnosis present

## 2022-12-27 DIAGNOSIS — Z79899 Other long term (current) drug therapy: Secondary | ICD-10-CM | POA: Diagnosis not present

## 2022-12-27 DIAGNOSIS — Z7982 Long term (current) use of aspirin: Secondary | ICD-10-CM | POA: Insufficient documentation

## 2022-12-27 DIAGNOSIS — Z951 Presence of aortocoronary bypass graft: Secondary | ICD-10-CM | POA: Insufficient documentation

## 2022-12-27 DIAGNOSIS — Z01810 Encounter for preprocedural cardiovascular examination: Secondary | ICD-10-CM

## 2022-12-27 DIAGNOSIS — I6523 Occlusion and stenosis of bilateral carotid arteries: Secondary | ICD-10-CM | POA: Insufficient documentation

## 2022-12-27 DIAGNOSIS — C541 Malignant neoplasm of endometrium: Secondary | ICD-10-CM | POA: Diagnosis not present

## 2022-12-27 DIAGNOSIS — I25118 Atherosclerotic heart disease of native coronary artery with other forms of angina pectoris: Secondary | ICD-10-CM | POA: Insufficient documentation

## 2022-12-27 DIAGNOSIS — I25110 Atherosclerotic heart disease of native coronary artery with unstable angina pectoris: Secondary | ICD-10-CM

## 2022-12-27 DIAGNOSIS — E785 Hyperlipidemia, unspecified: Secondary | ICD-10-CM | POA: Diagnosis not present

## 2022-12-27 HISTORY — DX: Headache, unspecified: R51.9

## 2022-12-27 HISTORY — DX: Malignant (primary) neoplasm, unspecified: C80.1

## 2022-12-27 LAB — COMPREHENSIVE METABOLIC PANEL
ALT: 14 U/L (ref 0–44)
AST: 16 U/L (ref 15–41)
Albumin: 3.9 g/dL (ref 3.5–5.0)
Alkaline Phosphatase: 64 U/L (ref 38–126)
Anion gap: 10 (ref 5–15)
BUN: 24 mg/dL — ABNORMAL HIGH (ref 8–23)
CO2: 22 mmol/L (ref 22–32)
Calcium: 8.9 mg/dL (ref 8.9–10.3)
Chloride: 109 mmol/L (ref 98–111)
Creatinine, Ser: 1.23 mg/dL — ABNORMAL HIGH (ref 0.44–1.00)
GFR, Estimated: 46 mL/min — ABNORMAL LOW (ref >60)
Glucose, Bld: 85 mg/dL (ref 70–99)
Potassium: 3.9 mmol/L (ref 3.5–5.1)
Sodium: 141 mmol/L (ref 135–145)
Total Bilirubin: 0.6 mg/dL (ref 0.3–1.2)
Total Protein: 7.5 g/dL (ref 6.5–8.1)

## 2022-12-27 LAB — CBC
HCT: 34.1 % — ABNORMAL LOW (ref 36.0–46.0)
Hemoglobin: 10.1 g/dL — ABNORMAL LOW (ref 12.0–15.0)
MCH: 23.8 pg — ABNORMAL LOW (ref 26.0–34.0)
MCHC: 29.6 g/dL — ABNORMAL LOW (ref 30.0–36.0)
MCV: 80.4 fL (ref 80.0–100.0)
Platelets: 223 10*3/uL (ref 150–400)
RBC: 4.24 MIL/uL (ref 3.87–5.11)
RDW: 16.2 % — ABNORMAL HIGH (ref 11.5–15.5)
WBC: 7.6 10*3/uL (ref 4.0–10.5)
nRBC: 0 % (ref 0.0–0.2)

## 2022-12-27 LAB — TYPE AND SCREEN

## 2022-12-27 MED ORDER — ISOSORBIDE MONONITRATE ER 60 MG PO TB24: 90.0000 mg | ORAL_TABLET | Freq: Every day | ORAL | 3 refills | Status: DC

## 2022-12-27 MED ORDER — ISOSORBIDE MONONITRATE ER 60 MG PO TB24: 90.0000 mg | ORAL_TABLET | Freq: Every day | ORAL | 3 refills | Status: AC

## 2022-12-27 NOTE — Telephone Encounter (Signed)
Pt called stating she has an upcoming Hysterectomy surgery on 01/04/23. I advised pt to have the Surgeons office fax our office with the requested clearance forms and we will be sure to have Dr. Karin Lieu look and determine if she is cleared. I provided pt with our fax number. Pt voiced her understanding.

## 2022-12-27 NOTE — Patient Instructions (Addendum)
Medication Instructions:   INCREASE Imdur one and one half (1.5) tablet by mouth (90 mg) daily.  *If you need a refill on your cardiac medications before your next appointment, please call your pharmacy*   Lab Work:  TODAY!!!! LIPID  If you have labs (blood work) drawn today and your tests are completely normal, you will receive your results only by: MyChart Message (if you have MyChart) OR A paper copy in the mail If you have any lab test that is abnormal or we need to change your treatment, we will call you to review the results.   Testing/Procedures:  You are scheduled for a Myocardial Perfusion Imaging Study on Tuesday, April 16 at 10:00 am.   Please arrive 15 minutes prior to your appointment time for registration and insurance purposes.   The test will take approximately 3 to 4 hours to complete; you may bring reading material. If someone comes with you to your appointment, they will need to remain in the main lobby due to limited space in the testing area.   How to prepare for your Myocardial Perfusion test:   Do not eat or drink 3 hours prior to your test, except you may have water.    Do not consume products containing caffeine (regular or decaffeinated) 12 hours prior to your test (ex: coffee, chocolate, soda, tea)   Do bring a list of your current medications with you. If not listed below, you may take your medications as normal.               Hold Maxzide am of test.   Bring any held medication to your appointment, as you may be required to take it once the test is complete.   Do wear comfortable clothes (no dresses or overalls) and walking shoes. Tennis shoes are preferred. No heels or open toed shoes.  Do not wear cologne, perfume, or lotions (deodorant is allowed).   If these instructions are not followed, you test will have to be rescheduled.   Please report to [address] for your test. If you have questions or concerns about your  appointment, please call the Nuclear Lab at #[phone].  If you cannot keep your appointment, please provide 24 hour notification to the Nuclear lab to avoid a possible $50 charge to your account.       Follow-Up: At [facility], you and your health needs are our priority.  As part of our continuing mission to provide you with exceptional heart care, we have created designated Provider Care Teams.  These Care Teams include your primary Cardiologist (physician) and Advanced Practice Providers (APPs -  Physician Assistants and Nurse Practitioners) who all work together to provide you with the care you need, when you need it.  We recommend signing up for the patient portal called "MyChart".  Sign up information is provided on this After Visit Summary.  MyChart is used to connect with patients for Virtual Visits (Telemedicine).  Patients are able to view lab/test results, encounter notes, upcoming appointments, etc.  Non-urgent messages can be sent to your provider as well.   To learn more about what you can do with MyChart, go to [email or web address].    Your next appointment:   As needed      Provider:   Thomasene Ripple,  MD   Other Instructions  Pt is switching providers.

## 2022-12-27 NOTE — Telephone Encounter (Signed)
-----   Message from Doylene Bode, NP sent at 12/27/2022  1:31 PM EDT ----- Please let the patient know that on her preoperative lab work her kidney function is slightly elevated at 1.23.  Please fax these results to her primary care and let the patient know to please be staying hydrated and avoiding NSAIDs (ibup on list).  Her hemoglobin is 10.1 as well.  Is she having significant bleeding? ----- Message ----- From: Leory Plowman, Lab In [place] Sent: 12/27/2022  12:15 PM EDT To: Doylene Bode, NP

## 2022-12-27 NOTE — Progress Notes (Signed)
   COVID Vaccine Completed: yes   Date of COVID positive in last 90 days: no   PCP - Darreld Mclean, MD Cardiologist - Debbe Odea, MD   Chest x-ray - n/a EKG - 12/27/22 Epic Stress Test - 04/12/21 Epic, repeat on 16th ECHO - 05/12/21 Epic Cardiac Cath - 05/20/21 Epic Pacemaker/ICD device last checked: n/a Spinal Cord Stimulator: n/a   Bowel Prep - light diet day before   Sleep Study - n/a CPAP -    Fasting Blood Sugar - n/a Checks Blood Sugar _____ times a day   Last dose of GLP1 agonist-  N/A GLP1 instructions:  N/A   Last dose of SGLT-2 inhibitors-  N/A SGLT-2 instructions: N/A     Blood Thinner Instructions: Aspirin Instructions: ASA 81, hold 2 days Last Dose:   Activity level:   Can go up a flight of stairs and perform activities of daily living without stopping and without symptoms of chest pain or shortness of breath. Occasional CP with activity , will take nitro if needed         Anesthesia review: CAD, HTN, cardiac clearance pt plans to contact Dr. For clearance    Patient denies shortness of breath, fever, cough and chest pain at PAT appointment   Patient verbalized understanding of instructions that were given to them at the PAT appointment. Patient was also instructed that they will need to review over the PAT instructions again at home before surgery.

## 2022-12-27 NOTE — Patient Instructions (Signed)
SURGICAL WAITING ROOM VISITATION  Patients having surgery or a procedure may have no more than 2 support people in the waiting area - these visitors may rotate.    Children under the age of 31 must have an adult with them who is not the patient.  Due to an increase in RSV and influenza rates and associated hospitalizations, children ages 65 and under may not visit patients in [facility] hospitals.  If the patient needs to stay at the hospital during part of their recovery, the visitor guidelines for inpatient rooms apply. Pre-op nurse will coordinate an appropriate time for 1 support person to accompany patient in pre-op.  This support person may not rotate.    Please refer to the [facility] website for the visitor guidelines for Inpatients (after your surgery is over and you are in a regular room).    Your procedure is scheduled on: 01/04/23   Report to [facility] Main Entrance    Report to admitting at 7:15 AM   Call this number if you have problems the morning of surgery [phone]   Do not eat food :After Midnight.   After Midnight you may have the following liquids until 6:30 AM DAY OF SURGERY  Water Non-Citrus Juices (without pulp, NO RED-Apple, White grape, White cranberry) Black Coffee (NO MILK/CREAM OR CREAMERS, sugar ok)  Clear Tea (NO MILK/CREAM OR CREAMERS, sugar ok) regular and decaf                             Plain Jell-O (NO RED)                                           Fruit ices (not with fruit pulp, NO RED)                                     Popsicles (NO RED)                                                               Sports drinks like Gatorade (NO RED)          If you have questions, please contact your surgeon's office.   FOLLOW BOWEL PREP AND ANY ADDITIONAL PRE OP INSTRUCTIONS YOU RECEIVED FROM YOUR SURGEON'S OFFICE!!!     Oral Hygiene is also important to reduce your risk of infection.                                    Remember -  BRUSH YOUR TEETH THE MORNING OF SURGERY WITH YOUR REGULAR TOOTHPASTE  DENTURES WILL BE REMOVED PRIOR TO SURGERY PLEASE DO NOT APPLY "Poly grip" OR ADHESIVES!!!   Take these medicines the morning of surgery with A SIP OF WATER: Atorvastatin, Carvedilol, Gabapentin                              You may not have any metal on  your body including hair pins, jewelry, and body piercing             Do not wear make-up, lotions, powders, perfumes, or deodorant  Do not wear nail polish including gel and S&S, artificial/acrylic nails, or any other type of covering on natural nails including finger and toenails. If you have artificial nails, gel coating, etc. that needs to be removed by a nail salon please have this removed prior to surgery or surgery may need to be canceled/ delayed if the surgeon/ anesthesia feels like they are unable to be safely monitored.   Do not shave  48 hours prior to surgery.    Do not bring valuables to the hospital. [facility] IS NOT             RESPONSIBLE   FOR VALUABLES.   Contacts, glasses, dentures or bridgework may not be worn into surgery.  DO NOT BRING YOUR HOME MEDICATIONS TO THE HOSPITAL. PHARMACY WILL DISPENSE MEDICATIONS LISTED ON YOUR MEDICATION LIST TO YOU DURING YOUR ADMISSION IN THE HOSPITAL!    Patients discharged on the day of surgery will not be allowed to drive home.  Someone NEEDS to stay with you for the first 24 hours after anesthesia.   Special Instructions: Bring a copy of your healthcare power of attorney and living will documents the day of surgery if you haven't scanned them before.              Please read over the following fact sheets you were given: IF YOU HAVE QUESTIONS ABOUT YOUR PRE-OP INSTRUCTIONS PLEASE CALL [phone] Adams- Tara Adams   If you received a COVID test during your pre-op visit  it is requested that you wear a mask when out in public, stay away from anyone that may not be feeling well and notify your surgeon if you develop  symptoms. If you test positive for Covid or have been in contact with anyone that has tested positive in the last 10 days please notify you surgeon.    [facility] - Preparing for Surgery Before surgery, you can play an important role.  Because skin is not sterile, your skin needs to be as free of germs as possible.  You can reduce the number of germs on your skin by washing with CHG (chlorahexidine gluconate) soap before surgery.  CHG is an antiseptic cleaner which kills germs and bonds with the skin to continue killing germs even after washing. Please DO NOT use if you have an allergy to CHG or antibacterial soaps.  If your skin becomes reddened/irritated stop using the CHG and inform your nurse when you arrive at Short Stay. Do not shave (including legs and underarms) for at least 48 hours prior to the first CHG shower.  You may shave your face/neck.  Please follow these instructions carefully:  1.  Shower with CHG Soap the night before surgery and the  morning of surgery.  2.  If you choose to wash your hair, wash your hair first as usual with your normal  shampoo.  3.  After you shampoo, rinse your hair and body thoroughly to remove the shampoo.                             4.  Use CHG as you would any other liquid soap.  You can apply chg directly to the skin and wash.  Gently with a scrungie or clean washcloth.  5.  Apply  the CHG Soap to your body ONLY FROM THE NECK DOWN.   Do   not use on face/ open                           Wound or open sores. Avoid contact with eyes, ears mouth and   genitals (private parts).                       Wash face,  Genitals (private parts) with your normal soap.             6.  Wash thoroughly, paying special attention to the area where your    surgery  will be performed.  7.  Thoroughly rinse your body with warm water from the neck down.  8.  DO NOT shower/wash with your normal soap after using and rinsing off the CHG Soap.                9.  Pat yourself dry  with a clean towel.            10.  Wear clean pajamas.            11.  Place clean sheets on your bed the night of your first shower and do not  sleep with pets. Day of Surgery : Do not apply any lotions/deodorants the morning of surgery.  Please wear clean clothes to the hospital/surgery center.  FAILURE TO FOLLOW THESE INSTRUCTIONS MAY RESULT IN THE CANCELLATION OF YOUR SURGERY  PATIENT SIGNATURE_________________________________  NURSE SIGNATURE__________________________________  ________________________________________________________________________  Tara Adams  An incentive spirometer is a tool that can help keep your lungs clear and active. This tool measures how well you are filling your lungs with each breath. Taking long deep breaths may help reverse or decrease the chance of developing breathing (pulmonary) problems (especially infection) following: A long period of time when you are unable to move or be active. BEFORE THE PROCEDURE  If the spirometer includes an indicator to show your best effort, your nurse or respiratory therapist will set it to a desired goal. If possible, sit up straight or lean slightly forward. Try not to slouch. Hold the incentive spirometer in an upright position. INSTRUCTIONS FOR USE  Sit on the edge of your bed if possible, or sit up as far as you can in bed or on a chair. Hold the incentive spirometer in an upright position. Breathe out normally. Place the mouthpiece in your mouth and seal your lips tightly around it. Breathe in slowly and as deeply as possible, raising the piston or the ball toward the top of the column. Hold your breath for 3-5 seconds or for as long as possible. Allow the piston or ball to fall to the bottom of the column. Remove the mouthpiece from your mouth and breathe out normally. Rest for a few seconds and repeat Steps 1 through 7 at least 10 times every 1-2 hours when you are awake. Take your time and take a  few normal breaths between deep breaths. The spirometer may include an indicator to show your best effort. Use the indicator as a goal to work toward during each repetition. After each set of 10 deep breaths, practice coughing to be sure your lungs are clear. If you have an incision (the cut made at the time of surgery), support your incision when coughing by placing a pillow or rolled up towels firmly against it. Once you  are able to get out of bed, walk around indoors and cough well. You may stop using the incentive spirometer when instructed by your caregiver.  RISKS AND COMPLICATIONS Take your time so you do not get dizzy or light-headed. If you are in pain, you may need to take or ask for pain medication before doing incentive spirometry. It is harder to take a deep breath if you are having pain. AFTER USE Rest and breathe slowly and easily. It can be helpful to keep track of a log of your progress. Your caregiver can provide you with a simple table to help with this. If you are using the spirometer at home, follow these instructions: SEEK MEDICAL CARE IF:  You are having difficultly using the spirometer. You have trouble using the spirometer as often as instructed. Your pain medication is not giving enough relief while using the spirometer. You develop fever of 100.5 F (38.1 C) or higher. SEEK IMMEDIATE MEDICAL CARE IF:  You cough up bloody sputum that had not been present before. You develop fever of 102 F (38.9 C) or greater. You develop worsening pain at or near the incision site. MAKE SURE YOU:  Understand these instructions. Will watch your condition. Will get help right away if you are not doing well or get worse. Document Released: 01/15/2007 Document Revised: 11/27/2011 Document Reviewed: 03/18/2007 ExitCare Patient Information 2014 ExitCare, [place].   ________________________________________________________________________ WHAT IS A BLOOD TRANSFUSION? Blood Transfusion  Information  A transfusion is the replacement of blood or some of its parts. Blood is made up of multiple cells which provide different functions. Red blood cells carry oxygen and are used for blood loss replacement. White blood cells fight against infection. Platelets control bleeding. Plasma helps clot blood. Other blood products are available for specialized needs, such as hemophilia or other clotting disorders. BEFORE THE TRANSFUSION  Who gives blood for transfusions?  Healthy volunteers who are fully evaluated to make sure their blood is safe. This is blood bank blood. Transfusion therapy is the safest it has ever been in the practice of medicine. Before blood is taken from a donor, a complete history is taken to make sure that person has no history of diseases nor engages in risky social behavior (examples are intravenous drug use or sexual activity with multiple partners). The donor's travel history is screened to minimize risk of transmitting infections, such as malaria. The donated blood is tested for signs of infectious diseases, such as HIV and hepatitis. The blood is then tested to be sure it is compatible with you in order to minimize the chance of a transfusion reaction. If you or a relative donates blood, this is often done in anticipation of surgery and is not appropriate for emergency situations. It takes many days to process the donated blood. RISKS AND COMPLICATIONS Although transfusion therapy is very safe and saves many lives, the main dangers of transfusion include:  Getting an infectious disease. Developing a transfusion reaction. This is an allergic reaction to something in the blood you were given. Every precaution is taken to prevent this. The decision to have a blood transfusion has been considered carefully by your caregiver before blood is given. Blood is not given unless the benefits outweigh the risks. AFTER THE TRANSFUSION Right after receiving a blood transfusion,  you will usually feel much better and more energetic. This is especially true if your red blood cells have gotten low (anemic). The transfusion raises the level of the red blood cells which carry oxygen,  and this usually causes an energy increase. The nurse administering the transfusion will monitor you carefully for complications. HOME CARE INSTRUCTIONS  No special instructions are needed after a transfusion. You may find your energy is better. Speak with your caregiver about any limitations on activity for underlying diseases you may have. SEEK MEDICAL CARE IF:  Your condition is not improving after your transfusion. You develop redness or irritation at the intravenous (IV) site. SEEK IMMEDIATE MEDICAL CARE IF:  Any of the following symptoms occur over the next 12 hours: Shaking chills. You have a temperature by mouth above 102 F (38.9 C), not controlled by medicine. Chest, back, or muscle pain. People around you feel you are not acting correctly or are confused. Shortness of breath or difficulty breathing. Dizziness and fainting. You get a rash or develop hives. You have a decrease in urine output. Your urine turns a dark color or changes to pink, red, or brown. Any of the following symptoms occur over the next 10 days: You have a temperature by mouth above 102 F (38.9 C), not controlled by medicine. Shortness of breath. Weakness after normal activity. The white part of the eye turns yellow (jaundice). You have a decrease in the amount of urine or are urinating less often. Your urine turns a dark color or changes to pink, red, or brown. Document Released: 09/01/2000 Document Revised: 11/27/2011 Document Reviewed: 04/20/2008 [facility] Patient Information 2014 [place], [place].  _______________________________________________________________________

## 2022-12-27 NOTE — Telephone Encounter (Signed)
I saw this patient today for preoperative evaluation for upcoming surgery.  She is now living in [place] and would like to transfer her care from the [place] office.  Dr. Servando Salina would you be agreeable to take her as a new patient?  Thank you, Marcelino Duster

## 2022-12-27 NOTE — Telephone Encounter (Signed)
See below result note from Warner Mccreedy NP,  I LVM for patient to call office

## 2022-12-28 ENCOUNTER — Telehealth: Payer: Self-pay | Admitting: Cardiology

## 2022-12-28 LAB — TYPE AND SCREEN: DAT, IgG: NEGATIVE

## 2022-12-28 LAB — BPAM RBC
Blood Product Expiration Date: 202405032359
Unit Type and Rh: 6200

## 2022-12-28 LAB — LIPID PANEL
Chol/HDL Ratio: 3.1 ratio (ref 0.0–4.4)
Cholesterol, Total: 176 mg/dL (ref 100–199)
HDL: 56 mg/dL (ref >39)
LDL Chol Calc (NIH): 92 mg/dL (ref 0–99)
Triglycerides: 166 mg/dL — ABNORMAL HIGH (ref 0–149)
VLDL Cholesterol Cal: 28 mg/dL (ref 5–40)

## 2022-12-28 NOTE — Telephone Encounter (Signed)
I s/w Tara Adams with the [facility] and informed her that they will need put the referral in if they are requesting pt see vascular. Tara Adams thanked me for the help.

## 2022-12-28 NOTE — Telephone Encounter (Signed)
Told Tara Adams about increasing her fluid intake to at least 64 oz of caffeine free fluid a day due to elevated kidney  and no NSAIDs as noted below by Warner Mccreedy, NP. She has not had any bleeding. Patient stated that she was have a stress test on Tuesday 01-02-23. Warner Mccreedy, NP notified of above. Tara. Alers will drop off her FMLA papers up front with attention to [facility].

## 2022-12-28 NOTE — Telephone Encounter (Signed)
Tara Adams with [facility] is calling inquiring to see if our office will place the referral for the pt to get clearance from vascular, or if the gynecology oncology needs to put it in. Please advise.

## 2022-12-28 NOTE — Telephone Encounter (Signed)
Please schedule patient to see Dr. Servando Salina for follow-up in 3-4 months.  Thank you, Marcelino Duster

## 2022-12-28 NOTE — Telephone Encounter (Signed)
Per cardiology fax surgical optimization form to vascular DR Karin Lieu

## 2022-12-28 NOTE — Progress Notes (Signed)
Anesthesia Chart Review   Case: [id number] Date/Time: 01/04/23 0915   Procedures:      XI ROBOTIC ASSISTED TOTAL HYSTERECTOMY WITH BILATERAL SALPINGO OOPHORECTOMY (Bilateral)     SENTINEL NODE BIOPSY     POSSIBLE LYMPH NODE DISSECTION   Anesthesia type: General   Pre-op diagnosis: ENDOMETRIAL CANCER   Location: [facility] ROOM 05 / [facility]   Surgeons: Carver Fila, MD       DISCUSSION:72 y.o. former smoker with h/o HTN, CAD (CABG 2002), endometrial cancer scheduled for above procedure 01/04/2023 with Dr. Eugene Garnet.   Pt last seen by cardiology 12/27/2022. Per OV note, "She is able to achieve > 4 METS activity on a consistent basis, but continues to have angina. We will get a lexiscan myoview for evaluation of ischemia. Risk for MACE is 6.6 secondary to history of cerebrovascular disease and ischemic heart disease.  I have encouraged her to seek clearance from vascular surgery for stenosis in left carotid artery as well. Will await nuclear stress test results for further recommendations."  Stress test 01/02/2023 low risk study.  Per cardio note, "Stress test result shows no evidence of decreased blood flow.  This study is low risk.  There is no evidence of prior heart attack.  You may proceed with your GYN surgery. Recommend follow-up appointment in 6 months with Dr. Servando Salina."  Pt with left carotid stenosis, seen by vascular surgery in the past.  Last seen 09/02/21, at this time left carotid endarterectomy scheduled. This procedure was not done, per notes she was seeking a second opinion. No further imaging since that time.   Clearance received from Dr. Karin Lieu which states pt has no vascular contraindications.    Discussed with Dr. Renold Don who states he will reach out to vascular surgeon to discuss.  VS: BP (!) 142/90   Pulse 62   Temp 36.9 C   Resp 16   Ht 5\' 4"  (1.626 m)   Wt 93.9 kg   SpO2 98%   BMI 35.53 kg/m   PROVIDERS: [name], MD is PCP   Cardiologist - Debbe Odea, MD  LABS: Labs reviewed: Acceptable for surgery. (all labs ordered are listed, but only abnormal results are displayed)  Labs Reviewed  CBC - Abnormal; Notable for the following components:      Result Value   Hemoglobin 10.1 (*)    HCT 34.1 (*)    MCH 23.8 (*)    MCHC 29.6 (*)    RDW 16.2 (*)    All other components within normal limits  COMPREHENSIVE METABOLIC PANEL - Abnormal; Notable for the following components:   BUN 24 (*)    Creatinine, Ser 1.23 (*)    GFR, Estimated 46 (*)    All other components within normal limits  TYPE AND SCREEN     IMAGES:   EKG:   CV: Echo 05/12/2021 1. Left ventricular ejection fraction, by estimation, is 50 to 55%. The  left ventricle has low normal function. The left ventricle has no regional  wall motion abnormalities. Left ventricular diastolic parameters are  consistent with Grade I diastolic  dysfunction (impaired relaxation).   2. Right ventricular systolic function is normal. The right ventricular  size is normal.   3. The mitral valve is normal in structure. Mild mitral valve  regurgitation.   4. The aortic valve is tricuspid. Aortic valve regurgitation is not  visualized. Mild to moderate aortic valve sclerosis/calcification is  present, without any evidence of aortic stenosis.  5. The inferior vena cava is normal in size with greater than 50%  respiratory variability, suggesting right atrial pressure of 3 mmHg.   Cardiac Cath 05/20/21   Prox RCA to Mid RCA lesion is 100% stenosed.   Ost Cx to Prox Cx lesion is 100% stenosed.   Prox LAD to Mid LAD lesion is 100% stenosed.   Dist SVG to LCx to Insertion lesion is 95% stenosed.   Dist LAD lesion is 80% stenosed.   Conclusion: Severe native three-vessel coronary artery disease with chronic total occlusion of the LAD, left circumflex, and RCA. Patent LIMA to LAD that supplies collaterals to the RCA system. SVG to left circumflex has a 90% stenosis at the distal  insertion site supplying a diffusely diseased circumflex vessel. Overall, small territory. The RCA is unbypassed and chronically occluded supplied by left-to-right collaterals.   Recommendations: Medical management of angina.  The benefit of revascularizing the distal SVG graft is minimal and is high risk for occlusion of the branch vessels. Aspirin 81 mg indefinitely. Aggressive secondary prevention. Past Medical History:  Diagnosis Date   Cancer    andometrial   Carotid artery occlusion    Coronary artery disease    a. ~ 2002 s/p CABG x 2 ([facility]); b. 03/2021 MV: Inferior, inferolateral, mid and apical anterior ischemia. EF 41%. High risk study.   Headache    migraines   Hyperlipidemia    Hypertension    Morbid obesity    Sciatica     Past Surgical History:  Procedure Laterality Date   BREAST BIOPSY Left 11/09/2022   pathology - no cancer   BREAST BIOPSY Left 11/09/2022   US LT BREAST BX W LOC DEV 1ST LESION IMG BX SPEC US GUIDE 11/09/2022 [facility] MAMMOGRAPHY   CATARACT EXTRACTION Bilateral 10/2022   CESAREAN SECTION     CORONARY ARTERY BYPASS GRAFT N/A    LEFT HEART CATH AND CORS/GRAFTS ANGIOGRAPHY N/A 05/20/2021   Procedure: LEFT HEART CATH AND CORS/GRAFTS ANGIOGRAPHY;  Surgeon: Iran OuchArida, Muhammad A, MD;  Location: [facility] INVASIVE CV LAB;  Service: Cardiovascular;  Laterality: N/A;    MEDICATIONS:  Ascorbic Acid (VITAMIN C) 1000 MG tablet   aspirin EC 81 MG tablet   atorvastatin (LIPITOR) 80 MG tablet   butalbital-acetaminophen-caffeine (FIORICET) 50-325-40 MG tablet   CALCIUM-VITAMIN D PO   carvedilol (COREG) 25 MG tablet   gabapentin (NEURONTIN) 100 MG capsule   hydrocortisone cream 0.5 %   ibuprofen (ADVIL) 200 MG tablet   isosorbide mononitrate (IMDUR) 60 MG 24 hr tablet   losartan (COZAAR) 100 MG tablet   Multiple Vitamin (MULTIVITAMIN WITH MINERALS) TABS tablet   Multiple Vitamins-Minerals (HAIR/SKIN/NAILS/BIOTIN PO)   nitroGLYCERIN (NITROSTAT) 0.4 MG  SL tablet   norethindrone (AYGESTIN) 5 MG tablet   Omega-3 Fatty Acids (FISH OIL PO)   OVER THE COUNTER MEDICATION   Polyethyl Glycol-Propyl Glycol (SYSTANE OP)   prednisoLONE acetate (PRED FORTE) 1 % ophthalmic suspension   Probiotic Product (PROBIOTIC PO)   senna-docusate (SENOKOT-S) 8.6-50 MG tablet   tiZANidine (ZANAFLEX) 4 MG tablet   traMADol (ULTRAM) 50 MG tablet   triamterene-hydrochlorothiazide (MAXZIDE) 75-50 MG tablet   vitamin B-12 (CYANOCOBALAMIN) 1000 MCG tablet   No current facility-administered medications for this encounter.     Jodell CiproJessica Aliscia Clayton Ward, PA-C [facility]urgical [facility] [phone]

## 2022-12-29 ENCOUNTER — Telehealth (HOSPITAL_COMMUNITY): Payer: Self-pay | Admitting: *Deleted

## 2022-12-29 NOTE — Telephone Encounter (Signed)
Received clearance from Vascular.

## 2022-12-29 NOTE — Telephone Encounter (Signed)
No DPR on file, my chart pending. Left generic message for pt to call back.

## 2023-01-02 ENCOUNTER — Ambulatory Visit (HOSPITAL_COMMUNITY): Payer: Medicare HMO | Attending: Cardiology

## 2023-01-02 ENCOUNTER — Telehealth: Payer: Self-pay

## 2023-01-02 DIAGNOSIS — I10 Essential (primary) hypertension: Secondary | ICD-10-CM | POA: Insufficient documentation

## 2023-01-02 DIAGNOSIS — I25110 Atherosclerotic heart disease of native coronary artery with unstable angina pectoris: Secondary | ICD-10-CM

## 2023-01-02 DIAGNOSIS — Z01810 Encounter for preprocedural cardiovascular examination: Secondary | ICD-10-CM

## 2023-01-02 LAB — MYOCARDIAL PERFUSION IMAGING
LV dias vol: 104 mL (ref 46–106)
LV sys vol: 55 mL
Nuc Stress EF: 48 %
Peak HR: 81 {beats}/min
Rest HR: 73 {beats}/min
Rest Nuclear Isotope Dose: 10.2 mCi
SDS: 9
SRS: 1
SSS: 10
ST Depression (mm): 0 mm
Stress Nuclear Isotope Dose: 31.9 mCi
TID: 1.22

## 2023-01-02 MED ORDER — TECHNETIUM TC 99M TETROFOSMIN IV KIT
31.9000 | PACK | Freq: Once | INTRAVENOUS | Status: AC | PRN
  Administered 2023-01-02: 31.9 via INTRAVENOUS

## 2023-01-02 MED ORDER — TECHNETIUM TC 99M TETROFOSMIN IV KIT
10.2000 | PACK | Freq: Once | INTRAVENOUS | Status: AC | PRN
  Administered 2023-01-02: 10.2 via INTRAVENOUS

## 2023-01-02 MED ORDER — REGADENOSON 0.4 MG/5ML IV SOLN
0.4000 mg | Freq: Once | INTRAVENOUS | Status: AC
Start: 2023-01-02 — End: 2023-01-02
  Administered 2023-01-02: 0.4 mg via INTRAVENOUS

## 2023-01-02 NOTE — Telephone Encounter (Signed)
[employer] disability /FMLA forms faxed and confirmed to Assunta Found (F) [phone] Pt aware via voicemail

## 2023-01-03 ENCOUNTER — Telehealth: Payer: Self-pay | Admitting: Oncology

## 2023-01-03 ENCOUNTER — Telehealth: Payer: Self-pay

## 2023-01-03 ENCOUNTER — Telehealth: Payer: Self-pay | Admitting: *Deleted

## 2023-01-03 DIAGNOSIS — E785 Hyperlipidemia, unspecified: Secondary | ICD-10-CM

## 2023-01-03 MED ORDER — EZETIMIBE 10 MG PO TABS: 10.0000 mg | ORAL_TABLET | Freq: Every day | ORAL | 3 refills | Status: DC

## 2023-01-03 NOTE — Telephone Encounter (Signed)
Talked to the triage nurse at Dr. Sherral Hammers office.  Explained that we did receive a completed surgical optimization form from their office saying that there are no vascular contraindications.  She said that only Dr. Sherral Hammers would sign the form, not the PA's.  She also said there is no way to get clearance today because patient would need an office visit and that the patient did have a vascular US carotid duplex on 05/12/2021 if that needs to be referenced.

## 2023-01-03 NOTE — Anesthesia Preprocedure Evaluation (Signed)
Anesthesia Evaluation  Patient identified by MRN, date of birth, ID band Patient awake    Reviewed: Allergy & Precautions, H&P , NPO status , Patient's Chart, lab work & pertinent test results  Airway Mallampati: II   Neck ROM: full    Dental   Pulmonary former smoker   breath sounds clear to auscultation       Cardiovascular hypertension, + CAD and + CABG   Rhythm:regular Rate:Normal  Stress test (01/02/23): no ischemia. Normal study  TTE (2022): EF 50-55%, normal valves   Neuro/Psych  Headaches    GI/Hepatic   Endo/Other    Renal/GU      Musculoskeletal   Abdominal   Peds  Hematology   Anesthesia Other Findings   Reproductive/Obstetrics                             Anesthesia Physical Anesthesia Plan  ASA: 3  Anesthesia Plan: General   Post-op Pain Management:    Induction: Intravenous  PONV Risk Score and Plan: 3 and Ondansetron, Dexamethasone and Treatment may vary due to age or medical condition  Airway Management Planned: Oral ETT  Additional Equipment:   Intra-op Plan:   Post-operative Plan: Extubation in OR  Informed Consent: I have reviewed the patients History and Physical, chart, labs and discussed the procedure including the risks, benefits and alternatives for the proposed anesthesia with the patient or authorized representative who has indicated his/her understanding and acceptance.     Dental advisory given  Plan Discussed with: CRNA, Anesthesiologist and Surgeon  Anesthesia Plan Comments: (See PAT note 12/27/2022)       Anesthesia Quick Evaluation

## 2023-01-03 NOTE — Telephone Encounter (Signed)
-----   Message from Levi Aland, NP sent at 12/28/2022  8:18 AM EDT ----- Triglycerides are elevated and LDL cholesterol is above goal of < 70. Due to your coronary and carotid artery disease, we need to get these numbers down. Are you consistently taking atorvastatin 80 mg daily? If so, would recommend that we add ezetimibe 10 mg daily. The addition of atorvastatin can add up to an additional 20% reduction. Will need to recheck lipid and ALT in 2-3 months. Also recommend heart healthy, mostly plant based diet including vegetables, fruits, whole grains, lean protein, beans, legumes, and nuts. Avoid processed foods, sugar, foods high in saturated fat, and simple carbohydrates.

## 2023-01-03 NOTE — Telephone Encounter (Signed)
Attempted to reach patient to check in with her pre-operatively. Unable to reach patient. Left message requesting return call.   ?

## 2023-01-03 NOTE — Telephone Encounter (Signed)
Left a message for the triage nurse with [facility] regarding surgical clearance.

## 2023-01-03 NOTE — Telephone Encounter (Signed)
Received clearance

## 2023-01-03 NOTE — Telephone Encounter (Signed)
Pt has been made aware of her lab results / recommendations. 

## 2023-01-03 NOTE — Telephone Encounter (Signed)
Telephone call to check on pre-operative status.  Patient compliant with pre-operative instructions.  Reinforced nothing to eat after midnight. Clear liquids until 0630. Patient to arrive at 0715.  No questions or concerns voiced.  Instructed to call for any needs. 

## 2023-01-04 ENCOUNTER — Ambulatory Visit (HOSPITAL_COMMUNITY): Payer: Medicare HMO | Admitting: Physician Assistant

## 2023-01-04 ENCOUNTER — Ambulatory Visit (HOSPITAL_BASED_OUTPATIENT_CLINIC_OR_DEPARTMENT_OTHER): Payer: Medicare HMO | Admitting: Anesthesiology

## 2023-01-04 ENCOUNTER — Encounter: Payer: Self-pay | Admitting: Gynecologic Oncology

## 2023-01-04 ENCOUNTER — Ambulatory Visit (HOSPITAL_COMMUNITY)
Admission: RE | Admit: 2023-01-04 | Discharge: 2023-01-05 | Disposition: A | Discharge status: Home / Self Care | Payer: Medicare HMO | Source: Ambulatory Visit | Attending: Gynecologic Oncology | Admitting: Gynecologic Oncology

## 2023-01-04 ENCOUNTER — Encounter (HOSPITAL_COMMUNITY): Payer: Self-pay | Admitting: Gynecologic Oncology

## 2023-01-04 ENCOUNTER — Other Ambulatory Visit: Payer: Self-pay

## 2023-01-04 ENCOUNTER — Encounter (HOSPITAL_COMMUNITY)
Admission: RE | Disposition: A | Discharge status: Home / Self Care | Payer: Self-pay | Source: Ambulatory Visit | Attending: Gynecologic Oncology

## 2023-01-04 DIAGNOSIS — I2510 Atherosclerotic heart disease of native coronary artery without angina pectoris: Secondary | ICD-10-CM

## 2023-01-04 DIAGNOSIS — D251 Intramural leiomyoma of uterus: Secondary | ICD-10-CM | POA: Insufficient documentation

## 2023-01-04 DIAGNOSIS — N736 Female pelvic peritoneal adhesions (postinfective): Secondary | ICD-10-CM | POA: Insufficient documentation

## 2023-01-04 DIAGNOSIS — Z87891 Personal history of nicotine dependence: Secondary | ICD-10-CM

## 2023-01-04 DIAGNOSIS — Z951 Presence of aortocoronary bypass graft: Secondary | ICD-10-CM

## 2023-01-04 DIAGNOSIS — Z01818 Encounter for other preprocedural examination: Secondary | ICD-10-CM

## 2023-01-04 DIAGNOSIS — C541 Malignant neoplasm of endometrium: Secondary | ICD-10-CM | POA: Diagnosis not present

## 2023-01-04 DIAGNOSIS — I25118 Atherosclerotic heart disease of native coronary artery with other forms of angina pectoris: Secondary | ICD-10-CM

## 2023-01-04 DIAGNOSIS — E876 Hypokalemia: Secondary | ICD-10-CM

## 2023-01-04 DIAGNOSIS — I10 Essential (primary) hypertension: Secondary | ICD-10-CM | POA: Diagnosis not present

## 2023-01-04 DIAGNOSIS — N3289 Other specified disorders of bladder: Secondary | ICD-10-CM

## 2023-01-04 HISTORY — PX: ROBOTIC ASSISTED TOTAL HYSTERECTOMY WITH BILATERAL SALPINGO OOPHERECTOMY: SHX6086

## 2023-01-04 HISTORY — PX: SENTINEL NODE BIOPSY: SHX6608

## 2023-01-04 LAB — TYPE AND SCREEN
ABO/RH(D): A POS
ABO/RH(D): A POS
Antibody Screen: POSITIVE
Antibody Screen: POSITIVE
Donor AG Type: NEGATIVE
Donor AG Type: NEGATIVE
PT AG Type: NEGATIVE
Unit division: 0
Unit division: 0
Unit division: 0

## 2023-01-04 LAB — BPAM RBC
Blood Product Expiration Date: 202405032359
Unit Type and Rh: 6200
Unit Type and Rh: 6200
Unit Type and Rh: 6200

## 2023-01-04 SURGERY — HYSTERECTOMY, TOTAL, ROBOT-ASSISTED, LAPAROSCOPIC, WITH BILATERAL SALPINGO-OOPHORECTOMY
Anesthesia: General

## 2023-01-04 MED ORDER — SENNOSIDES-DOCUSATE SODIUM 8.6-50 MG PO TABS
2.0000 | ORAL_TABLET | Freq: Every day | ORAL | Status: DC
  Administered 2023-01-04: 2 via ORAL
  Filled 2023-01-04: qty 2

## 2023-01-04 MED ORDER — ENOXAPARIN SODIUM 40 MG/0.4ML IJ SOSY
40.0000 mg | PREFILLED_SYRINGE | INTRAMUSCULAR | Status: DC
  Administered 2023-01-05: 40 mg via SUBCUTANEOUS
  Filled 2023-01-04: qty 0.4

## 2023-01-04 MED ORDER — LIDOCAINE HCL (PF) 2 % IJ SOLN
INTRAMUSCULAR | Status: AC
  Filled 2023-01-04: qty 10

## 2023-01-04 MED ORDER — SIMETHICONE 80 MG PO CHEW
80.0000 mg | CHEWABLE_TABLET | Freq: Four times a day (QID) | ORAL | Status: DC | PRN
  Administered 2023-01-04: 80 mg via ORAL
  Filled 2023-01-04: qty 1

## 2023-01-04 MED ORDER — MIDAZOLAM HCL 5 MG/5ML IJ SOLN
INTRAMUSCULAR | Status: DC | PRN
  Administered 2023-01-04 (×2): 2 mg via INTRAVENOUS

## 2023-01-04 MED ORDER — ROCURONIUM BROMIDE 10 MG/ML (PF) SYRINGE
PREFILLED_SYRINGE | INTRAVENOUS | Status: AC
  Filled 2023-01-04: qty 10

## 2023-01-04 MED ORDER — HEMOSTATIC AGENTS (NO CHARGE) OPTIME
TOPICAL | Status: DC | PRN
  Administered 2023-01-04: 1 via TOPICAL

## 2023-01-04 MED ORDER — ISOSORBIDE MONONITRATE ER 30 MG PO TB24
30.0000 mg | ORAL_TABLET | Freq: Every day | ORAL | Status: DC
  Administered 2023-01-04: 30 mg via ORAL
  Filled 2023-01-04: qty 1

## 2023-01-04 MED ORDER — ACETAMINOPHEN 500 MG PO TABS
1000.0000 mg | ORAL_TABLET | Freq: Four times a day (QID) | ORAL | Status: DC
  Administered 2023-01-04 – 2023-01-05 (×4): 1000 mg via ORAL
  Filled 2023-01-04 (×4): qty 2

## 2023-01-04 MED ORDER — FENTANYL CITRATE (PF) 250 MCG/5ML IJ SOLN
INTRAMUSCULAR | Status: AC
  Filled 2023-01-04: qty 5

## 2023-01-04 MED ORDER — SUGAMMADEX SODIUM 200 MG/2ML IV SOLN
INTRAVENOUS | Status: DC | PRN
  Administered 2023-01-04: 200 mg via INTRAVENOUS

## 2023-01-04 MED ORDER — ACETAMINOPHEN 500 MG PO TABS
1000.0000 mg | ORAL_TABLET | ORAL | Status: AC
  Administered 2023-01-04: 1000 mg via ORAL
  Filled 2023-01-04: qty 2

## 2023-01-04 MED ORDER — POLYVINYL ALCOHOL 1.4 % OP SOLN
1.0000 [drp] | Freq: Three times a day (TID) | OPHTHALMIC | Status: DC
  Administered 2023-01-04 – 2023-01-05 (×3): 1 [drp] via OPHTHALMIC
  Filled 2023-01-04: qty 15

## 2023-01-04 MED ORDER — DEXAMETHASONE SODIUM PHOSPHATE 4 MG/ML IJ SOLN
4.0000 mg | INTRAMUSCULAR | Status: AC
  Administered 2023-01-04: 8 mg via INTRAVENOUS

## 2023-01-04 MED ORDER — BUPIVACAINE HCL 0.25 % IJ SOLN
INTRAMUSCULAR | Status: DC | PRN
  Administered 2023-01-04: 30 mL

## 2023-01-04 MED ORDER — POLYETHYL GLYCOL-PROPYL GLYCOL 0.4-0.3 % OP GEL: Freq: Three times a day (TID) | OPHTHALMIC | Status: DC

## 2023-01-04 MED ORDER — BUPIVACAINE LIPOSOME 1.3 % IJ SUSP
INTRAMUSCULAR | Status: AC
  Filled 2023-01-04: qty 20

## 2023-01-04 MED ORDER — STERILE WATER FOR IRRIGATION IR SOLN
Status: DC | PRN
  Administered 2023-01-04: 1000 mL

## 2023-01-04 MED ORDER — TRAMADOL HCL 50 MG PO TABS
50.0000 mg | ORAL_TABLET | Freq: Four times a day (QID) | ORAL | Status: DC | PRN
  Administered 2023-01-04 (×2): 50 mg via ORAL
  Filled 2023-01-04 (×2): qty 1

## 2023-01-04 MED ORDER — FENTANYL CITRATE PF 50 MCG/ML IJ SOSY
PREFILLED_SYRINGE | INTRAMUSCULAR | Status: AC
  Filled 2023-01-04: qty 1

## 2023-01-04 MED ORDER — EPHEDRINE 5 MG/ML INJ
INTRAVENOUS | Status: AC
  Filled 2023-01-04: qty 5

## 2023-01-04 MED ORDER — CEFAZOLIN SODIUM-DEXTROSE 2-4 GM/100ML-% IV SOLN
2.0000 g | INTRAVENOUS | Status: AC
  Administered 2023-01-04: 2 g via INTRAVENOUS
  Filled 2023-01-04: qty 100

## 2023-01-04 MED ORDER — ONDANSETRON HCL 4 MG/2ML IJ SOLN
INTRAMUSCULAR | Status: DC | PRN
  Administered 2023-01-04: 4 mg via INTRAVENOUS

## 2023-01-04 MED ORDER — OXYCODONE HCL 5 MG/5ML PO SOLN: 5.0000 mg | Freq: Once | ORAL | Status: DC | PRN

## 2023-01-04 MED ORDER — PREDNISOLONE ACETATE 1 % OP SUSP: 1.0000 [drp] | Freq: Four times a day (QID) | OPHTHALMIC | Status: DC

## 2023-01-04 MED ORDER — PROPOFOL 10 MG/ML IV BOLUS
INTRAVENOUS | Status: AC
  Filled 2023-01-04: qty 20

## 2023-01-04 MED ORDER — DIPHENHYDRAMINE HCL 25 MG PO CAPS: 25.0000 mg | ORAL_CAPSULE | Freq: Four times a day (QID) | ORAL | Status: DC | PRN

## 2023-01-04 MED ORDER — GABAPENTIN 100 MG PO CAPS: 100.0000 mg | ORAL_CAPSULE | Freq: Every day | ORAL | Status: DC | PRN

## 2023-01-04 MED ORDER — ONDANSETRON HCL 4 MG/2ML IJ SOLN: 4.0000 mg | Freq: Four times a day (QID) | INTRAMUSCULAR | Status: DC | PRN

## 2023-01-04 MED ORDER — TIZANIDINE HCL 4 MG PO TABS: 4.0000 mg | ORAL_TABLET | ORAL | Status: DC | PRN

## 2023-01-04 MED ORDER — SODIUM CHLORIDE 0.9 % IV SOLN: INTRAVENOUS | Status: DC

## 2023-01-04 MED ORDER — STERILE WATER FOR INJECTION IJ SOLN
INTRAMUSCULAR | Status: DC | PRN
  Administered 2023-01-04: 4 mL via INTRAMUSCULAR

## 2023-01-04 MED ORDER — LIDOCAINE HCL (CARDIAC) PF 100 MG/5ML IV SOSY
PREFILLED_SYRINGE | INTRAVENOUS | Status: DC | PRN
  Administered 2023-01-04: 60 mg via INTRAVENOUS

## 2023-01-04 MED ORDER — ROCURONIUM BROMIDE 100 MG/10ML IV SOLN
INTRAVENOUS | Status: DC | PRN
  Administered 2023-01-04: 50 mg via INTRAVENOUS
  Administered 2023-01-04: 20 mg via INTRAVENOUS

## 2023-01-04 MED ORDER — FENTANYL CITRATE PF 50 MCG/ML IJ SOSY
25.0000 ug | PREFILLED_SYRINGE | INTRAMUSCULAR | Status: DC | PRN
  Administered 2023-01-04 (×2): 25 ug via INTRAVENOUS

## 2023-01-04 MED ORDER — OXYCODONE HCL 5 MG PO TABS: 5.0000 mg | ORAL_TABLET | Freq: Once | ORAL | Status: DC | PRN

## 2023-01-04 MED ORDER — CHLORHEXIDINE GLUCONATE 0.12 % MT SOLN
15.0000 mL | Freq: Once | OROMUCOSAL | Status: AC
  Administered 2023-01-04: 15 mL via OROMUCOSAL

## 2023-01-04 MED ORDER — MIDAZOLAM HCL 2 MG/2ML IJ SOLN
INTRAMUSCULAR | Status: AC
  Filled 2023-01-04: qty 2

## 2023-01-04 MED ORDER — ONDANSETRON HCL 4 MG/2ML IJ SOLN
INTRAMUSCULAR | Status: AC
  Filled 2023-01-04: qty 2

## 2023-01-04 MED ORDER — FENTANYL CITRATE (PF) 100 MCG/2ML IJ SOLN
INTRAMUSCULAR | Status: AC
  Filled 2023-01-04: qty 2

## 2023-01-04 MED ORDER — SODIUM CHLORIDE (PF) 0.9 % IJ SOLN
INTRAMUSCULAR | Status: DC | PRN
  Administered 2023-01-04: 40 mL

## 2023-01-04 MED ORDER — LACTATED RINGERS IV SOLN: INTRAVENOUS | Status: DC

## 2023-01-04 MED ORDER — NITROGLYCERIN 0.4 MG SL SUBL: 0.4000 mg | SUBLINGUAL_TABLET | SUBLINGUAL | Status: DC | PRN

## 2023-01-04 MED ORDER — LACTATED RINGERS IV SOLN: INTRAVENOUS | Status: DC | PRN

## 2023-01-04 MED ORDER — HYDROMORPHONE HCL 1 MG/ML IJ SOLN: 0.2000 mg | INTRAMUSCULAR | Status: DC | PRN

## 2023-01-04 MED ORDER — LIDOCAINE HCL (PF) 2 % IJ SOLN
INTRAMUSCULAR | Status: DC | PRN
  Administered 2023-01-04: 1.5 mg/kg/h via INTRADERMAL

## 2023-01-04 MED ORDER — CARVEDILOL 25 MG PO TABS
25.0000 mg | ORAL_TABLET | Freq: Two times a day (BID) | ORAL | Status: DC
  Administered 2023-01-04 – 2023-01-05 (×2): 25 mg via ORAL
  Filled 2023-01-04 (×2): qty 1

## 2023-01-04 MED ORDER — ISOSORBIDE MONONITRATE ER 60 MG PO TB24
60.0000 mg | ORAL_TABLET | Freq: Every day | ORAL | Status: DC
  Administered 2023-01-05: 60 mg via ORAL
  Filled 2023-01-04: qty 1

## 2023-01-04 MED ORDER — LACTATED RINGERS IR SOLN
Status: DC | PRN
  Administered 2023-01-04: 1000 mL

## 2023-01-04 MED ORDER — PROPOFOL 10 MG/ML IV BOLUS
INTRAVENOUS | Status: DC | PRN
  Administered 2023-01-04: 200 mg via INTRAVENOUS

## 2023-01-04 MED ORDER — HEPARIN SODIUM (PORCINE) 5000 UNIT/ML IJ SOLN
5000.0000 [IU] | INTRAMUSCULAR | Status: AC
  Administered 2023-01-04: 5000 [IU] via SUBCUTANEOUS
  Filled 2023-01-04: qty 1

## 2023-01-04 MED ORDER — ONDANSETRON HCL 4 MG PO TABS: 4.0000 mg | ORAL_TABLET | Freq: Four times a day (QID) | ORAL | Status: DC | PRN

## 2023-01-04 MED ORDER — PHENYLEPHRINE HCL-NACL 20-0.9 MG/250ML-% IV SOLN
INTRAVENOUS | Status: AC
  Filled 2023-01-04: qty 250

## 2023-01-04 MED ORDER — FENTANYL CITRATE (PF) 100 MCG/2ML IJ SOLN
INTRAMUSCULAR | Status: DC | PRN
  Administered 2023-01-04 (×3): 50 ug via INTRAVENOUS
  Administered 2023-01-04: 100 ug via INTRAVENOUS

## 2023-01-04 MED ORDER — ORAL CARE MOUTH RINSE: 15.0000 mL | Freq: Once | OROMUCOSAL | Status: AC

## 2023-01-04 MED ORDER — OXYCODONE HCL 5 MG PO TABS: 5.0000 mg | ORAL_TABLET | ORAL | Status: DC | PRN

## 2023-01-04 MED ORDER — DEXAMETHASONE SODIUM PHOSPHATE 10 MG/ML IJ SOLN
INTRAMUSCULAR | Status: AC
  Filled 2023-01-04: qty 1

## 2023-01-04 MED ORDER — STERILE WATER FOR INJECTION IJ SOLN
INTRAMUSCULAR | Status: AC
  Filled 2023-01-04: qty 10

## 2023-01-04 SURGICAL SUPPLY — 81 items
ADH SKN CLS APL DERMABOND .7 (GAUZE/BANDAGES/DRESSINGS) ×2
AGENT HMST KT MTR STRL THRMB (HEMOSTASIS)
APL ESCP 34 STRL LF DISP (HEMOSTASIS)
APL SRG 38 LTWT LNG FL B (MISCELLANEOUS) ×2
APPLICATOR ARISTA FLEXITIP XL (MISCELLANEOUS) IMPLANT
APPLICATOR SURGIFLO ENDO (HEMOSTASIS) IMPLANT
BAG COUNTER SPONGE SURGICOUNT (BAG) IMPLANT
BAG LAPAROSCOPIC 12 15 PORT 16 (BASKET) IMPLANT
BAG RETRIEVAL 12/15 (BASKET) ×2
BAG SPNG CNTER NS LX DISP (BAG)
BLADE SURG SZ10 CARB STEEL (BLADE) IMPLANT
COVER BACK TABLE 60X90IN (DRAPES) ×2 IMPLANT
COVER TIP SHEARS 8 DVNC (MISCELLANEOUS) ×2 IMPLANT
DERMABOND ADVANCED .7 DNX12 (GAUZE/BANDAGES/DRESSINGS) ×2 IMPLANT
DRAPE ARM DVNC X/XI (DISPOSABLE) ×8 IMPLANT
DRAPE COLUMN DVNC XI (DISPOSABLE) ×2 IMPLANT
DRAPE SHEET LG 3/4 BI-LAMINATE (DRAPES) ×2 IMPLANT
DRAPE SURG IRRIG POUCH 19X23 (DRAPES) ×2 IMPLANT
DRIVER NDL MEGA 8 DVNC XI (INSTRUMENTS) ×4 IMPLANT
DRIVER NDLE MEGA DVNC XI (INSTRUMENTS) ×4 IMPLANT
DRSG OPSITE POSTOP 4X6 (GAUZE/BANDAGES/DRESSINGS) IMPLANT
DRSG OPSITE POSTOP 4X8 (GAUZE/BANDAGES/DRESSINGS) IMPLANT
ELECT PENCIL ROCKER SW 15FT (MISCELLANEOUS) IMPLANT
ELECT REM PT RETURN 15FT ADLT (MISCELLANEOUS) ×2 IMPLANT
FORCEPS BPLR FENES DVNC XI (FORCEP) ×2 IMPLANT
FORCEPS PROGRASP DVNC XI (FORCEP) ×2 IMPLANT
GAUZE 4X4 16PLY ~~LOC~~+RFID DBL (SPONGE) ×4 IMPLANT
GLOVE BIO SURGEON STRL SZ 6 (GLOVE) ×8 IMPLANT
GLOVE BIO SURGEON STRL SZ 6.5 (GLOVE) ×2 IMPLANT
GOWN STRL REUS W/ TWL LRG LVL3 (GOWN DISPOSABLE) ×8 IMPLANT
GOWN STRL REUS W/TWL LRG LVL3 (GOWN DISPOSABLE) ×8
GRASPER SUT TROCAR 14GX15 (MISCELLANEOUS) IMPLANT
HEMOSTAT ARISTA ABSORB 3G PWDR (HEMOSTASIS) IMPLANT
HIBICLENS CHG 4% 4OZ BTL (MISCELLANEOUS) ×4 IMPLANT
HOLDER FOLEY CATH W/STRAP (MISCELLANEOUS) IMPLANT
IRRIG SUCT STRYKERFLOW 2 WTIP (MISCELLANEOUS) ×2
IRRIGATION SUCT STRKRFLW 2 WTP (MISCELLANEOUS) ×2 IMPLANT
KIT PROCEDURE DVNC SI (MISCELLANEOUS) IMPLANT
KIT TURNOVER KIT A (KITS) IMPLANT
LIGASURE IMPACT 36 18CM CVD LR (INSTRUMENTS) IMPLANT
MANIPULATOR ADVINCU DEL 3.0 PL (MISCELLANEOUS) IMPLANT
MANIPULATOR ADVINCU DEL 3.5 PL (MISCELLANEOUS) IMPLANT
MANIPULATOR UTERINE 4.5 ZUMI (MISCELLANEOUS) IMPLANT
NDL HYPO 21X1.5 SAFETY (NEEDLE) ×2 IMPLANT
NDL SPNL 18GX3.5 QUINCKE PK (NEEDLE) IMPLANT
NEEDLE HYPO 21X1.5 SAFETY (NEEDLE) ×2 IMPLANT
NEEDLE SPNL 18GX3.5 QUINCKE PK (NEEDLE) IMPLANT
OBTURATOR OPTICAL STND 8 DVNC (TROCAR) ×2
OBTURATOR OPTICALSTD 8 DVNC (TROCAR) ×2 IMPLANT
PACK ROBOT GYN CUSTOM WL (TRAY / TRAY PROCEDURE) ×2 IMPLANT
PAD POSITIONING PINK XL (MISCELLANEOUS) ×2 IMPLANT
PORT ACCESS TROCAR AIRSEAL 12 (TROCAR) IMPLANT
SCISSORS MNPLR CVD DVNC XI (INSTRUMENTS) ×2 IMPLANT
SEAL UNIV 5-12 XI (MISCELLANEOUS) ×6 IMPLANT
SET TRI-LUMEN FLTR TB AIRSEAL (TUBING) ×2 IMPLANT
SPIKE FLUID TRANSFER (MISCELLANEOUS) ×2 IMPLANT
SPONGE T-LAP 18X18 ~~LOC~~+RFID (SPONGE) IMPLANT
SURGIFLO W/THROMBIN 8M KIT (HEMOSTASIS) IMPLANT
SUT MNCRL AB 4-0 PS2 18 (SUTURE) IMPLANT
SUT PDS AB 1 TP1 96 (SUTURE) IMPLANT
SUT V-LOC 180 0-0 GS22 (SUTURE) IMPLANT
SUT VIC AB 0 CT1 27 (SUTURE)
SUT VIC AB 0 CT1 27XBRD ANTBC (SUTURE) IMPLANT
SUT VIC AB 2-0 CT1 27 (SUTURE)
SUT VIC AB 2-0 CT1 TAPERPNT 27 (SUTURE) IMPLANT
SUT VICRYL 0 27 CT2 27 ABS (SUTURE) ×2 IMPLANT
SUT VICRYL 4-0 PS2 18IN ABS (SUTURE) ×4 IMPLANT
SUT VLOC 180 0 9IN  GS21 (SUTURE)
SUT VLOC 180 0 9IN GS21 (SUTURE) IMPLANT
SYR 10ML LL (SYRINGE) IMPLANT
SYS BAG RETRIEVAL 10MM (BASKET)
SYS WOUND ALEXIS 18CM MED (MISCELLANEOUS)
SYSTEM BAG RETRIEVAL 10MM (BASKET) IMPLANT
SYSTEM WOUND ALEXIS 18CM MED (MISCELLANEOUS) IMPLANT
TOWEL OR NON WOVEN STRL DISP B (DISPOSABLE) IMPLANT
TRAP SPECIMEN MUCUS 40CC (MISCELLANEOUS) IMPLANT
TRAY FOLEY MTR SLVR 16FR STAT (SET/KITS/TRAYS/PACK) ×2 IMPLANT
TROCAR PORT AIRSEAL 5X120 (TROCAR) IMPLANT
UNDERPAD 30X36 HEAVY ABSORB (UNDERPADS AND DIAPERS) ×4 IMPLANT
WATER STERILE IRR 1000ML POUR (IV SOLUTION) ×2 IMPLANT
YANKAUER SUCT BULB TIP 10FT TU (MISCELLANEOUS) IMPLANT

## 2023-01-04 NOTE — Progress Notes (Signed)
Fax received from [facility] on 12/28/22 for medical clearance/medication hold for robotic assisted total laparoscopic hysterectomy, bilateral salping-oophorectomy, sentinel lymph node biopsy, possible lymph node dissection, possible laparotomy to be signed by Dr. Karin Lieu.  Provider signed on 12/29/22, form faxed back to sender on 12/29/22, verified successful, sent to scan center.

## 2023-01-04 NOTE — Transfer of Care (Signed)
Immediate Anesthesia Transfer of Care Note  Patient: Tara Adams  Procedure(s) Performed: XI ROBOTIC ASSISTED TOTAL HYSTERECTOMY WITH BILATERAL SALPINGO OOPHORECTOMY; CYSTOSCOPY (Bilateral) SENTINEL NODE BIOPSY  Patient Location: PACU  Anesthesia Type:General  Level of Consciousness: awake, alert , oriented, and patient cooperative  Airway & Oxygen Therapy: Patient Spontanous Breathing and Patient connected to face mask oxygen  Post-op Assessment: Report given to RN, Post -op Vital signs reviewed and stable, and Patient moving all extremities  Post vital signs: Reviewed and stable  Last Vitals:  Vitals Value Taken Time  BP 178/92 01/04/23 1257  Temp    Pulse 58 01/04/23 1259  Resp 17 01/04/23 1259  SpO2 100 % 01/04/23 1259  Vitals shown include unvalidated device data.  Last Pain:  Vitals:   01/04/23 0741  TempSrc: Oral  PainSc: 0-No pain         Complications: No notable events documented.

## 2023-01-04 NOTE — Interval H&P Note (Signed)
History and Physical Interval Note:  01/04/2023 7:06 AM  Tara Adams  has presented today for surgery, with the diagnosis of ENDOMETRIAL CANCER.  The various methods of treatment have been discussed with the patient and family. After consideration of risks, benefits and other options for treatment, the patient has consented to  Procedure(s): XI ROBOTIC ASSISTED TOTAL HYSTERECTOMY WITH BILATERAL SALPINGO OOPHORECTOMY (Bilateral) SENTINEL NODE BIOPSY (N/A) POSSIBLE LYMPH NODE DISSECTION (N/A) as a surgical intervention.  The patient's history has been reviewed, patient examined, no change in status, stable for surgery.  I have reviewed the patient's chart and labs.  Questions were answered to the patient's satisfaction.     Carver Fila

## 2023-01-04 NOTE — Anesthesia Procedure Notes (Signed)
Procedure Name: Intubation Date/Time: 01/04/2023 9:29 AM  Performed by: Johnette Abraham, CRNAPre-anesthesia Checklist: Patient identified, Emergency Drugs available, Suction available and Patient being monitored Patient Re-evaluated:Patient Re-evaluated prior to induction Oxygen Delivery Method: Circle System Utilized Preoxygenation: Pre-oxygenation with 100% oxygen Induction Type: IV induction Ventilation: Mask ventilation without difficulty Laryngoscope Size: Mac and 3 Grade View: Grade III Tube type: Oral Tube size: 7.0 mm Number of attempts: 1 Airway Equipment and Method: Stylet and Oral airway Placement Confirmation: ETT inserted through vocal cords under direct vision, positive ETCO2 and breath sounds checked- equal and bilateral Secured at: 22 cm Tube secured with: Tape Dental Injury: Teeth and Oropharynx as per pre-operative assessment

## 2023-01-04 NOTE — Op Note (Signed)
OPERATIVE NOTE  Pre-operative Diagnosis: endometrial cancer grade 1  Post-operative Diagnosis: same, pelvic adhesive disease, enlarged fibroid uterus  Operation: Robotic-assisted laparoscopic total hysterectomy with bilateral salpingoophorectomy, SLN biopsy, lysis of adhesions for approximately 30 minutes, mini-lap for specimen delivery (uterus > 250gms) , cystoscopy   Surgeon: Eugene Garnet MD  Assistant Surgeon: Warner Mccreedy NP  Anesthesia: GET  Urine Output: 400 cc  Operative Findings: On EUA, enlarged, moderately mobile uterus.  On intra-abdominal entry, normal upper abdominal survey.  Omentum with adhesions to the anterior abdominal wall below the level of the umbilicus and to the anterior and fundal uterus as well as bilateral pelvic sidewalls and the bladder peritoneum.  Normal-appearing small and large bowel.  Normal-appearing bilateral adnexa.  Uterus enlarged, approximately 12-14 cm with multiple intramural fibroids, the largest measuring approximately 4 cm.  Uterus itself was quite bulbous, pelvis narrow.  Mapping successful to bilateral deep obturator sentinel lymph nodes.  No obvious lymphadenopathy.  No peritoneal findings.  Given significant bladder dissection given omental adhesions as well as adhesions between the bladder and the anterior uterus, cystoscopy performed.  Bladder dome intact, good efflux from bilateral ureteral orifices.  Estimated Blood Loss:  100 cc      Total IV Fluids: see I&O flowsheet         Specimens: uterus, cervix, bilateral tubes and ovaries, bilateral obturator SLNs         Complications:  None apparent; patient tolerated the procedure well.         Disposition: PACU - hemodynamically stable.  Procedure Details  The patient was seen in the Holding Room. The risks, benefits, complications, treatment options, and expected outcomes were discussed with the patient.  The patient concurred with the proposed plan, giving informed consent.  The  site of surgery properly noted/marked. The patient was identified as Tara Adams and the procedure verified as a Robotic-assisted hysterectomy with bilateral salpingo oophorectomy with SLN biopsy.   After induction of anesthesia, the patient was draped and prepped in the usual sterile manner. Patient was placed in supine position after anesthesia and draped and prepped in the usual sterile manner as follows: Her arms were tucked to her side with all appropriate precautions.  The patient was secured to the bed using padding and tape across her chest.  The patient was placed in the semi-lithotomy position in [place] stirrups.  The perineum and vagina were prepped with Betadine. The patient's abdomen was prepped with ChloraPrep and she was draped after the prep had been allowed to dry for 3 minutes.  A Time Out was held and the above information confirmed.  The urethra was prepped with Betadine. Foley catheter was placed.  A sterile speculum was placed in the vagina.  The cervix was grasped with a single-tooth tenaculum.  total of ICG was injected into the cervical stroma at 2 and 9 o'clock with 1cc injected at a 1cm and 2mm depth (concentration 0.5mg /ml) in all locations. The cervix was dilated with Shawnie Pons dilators.  The Delineator uterine manipulator with a 3.0 colpotomizer ring was placed without difficulty.  A pneum occluder balloon was placed over the manipulator.  OG tube placement was confirmed and to suction.   Next, a 10 mm skin incision was made 1 cm below the subcostal margin in the midclavicular line.  The 5 mm Optiview port and scope was used for direct entry.  Opening pressure was under 10 mm CO2.  The abdomen was insufflated and the findings were noted as above.  At this point and all points during the procedure, the patient's intra-abdominal pressure did not exceed 15 mmHg. Next, an 8 mm skin incision was made superior to the umbilicus and a right and left port were placed about 8 cm lateral  to the robot port on the right and left side.  A fourth arm was placed on the right.  The 5 mm assist trocar was exchanged for a 10-12 mm port. All ports were placed under direct visualization.  The patient was placed in steep Trendelenburg.  The robot was docked in the normal manner.  Attention was first turned anteriorly after the robot was docked.  Commendation of monopolar and bipolar electrocautery as well as sharp dissection was used to lyse adhesions between the omentum and the anterior abdominal wall.  The omentum was then mobilized from the anterior and superior surface of the uterus, ultimately freeing it completely although some omentum was left on the uterus itself as well as the bladder peritoneum.  The right and left peritoneum were opened parallel to the IP ligament to open the retroperitoneal spaces bilaterally. The round ligaments were transected. The SLN mapping was performed in bilateral pelvic basins. After identifying the ureters, the para rectal and paravesical spaces were opened up entirely with careful dissection below the level of the ureters bilaterally and to the depth of the uterine artery origin in order to skeletonize the uterine "web" and ensure visualization of all parametrial channels. The para-aortic basins were carefully exposed and evaluated for isolated para-aortic SLN's. Lymphatic channels were identified travelling to the following visualized sentinel lymph node's: bilateral obturator. These SLN's were separated from their surrounding lymphatic tissue, removed and sent for permanent pathology.    The hysterectomy was started.  The ureter was again noted to be on the medial leaf of the broad ligament.  The peritoneum above the ureter was incised and stretched and the infundibulopelvic ligament was skeletonized, cauterized and cut.  The posterior peritoneum was taken down to the level of the KOH ring.  The anterior peritoneum was also taken down.  The bladder flap was  created to the level of the KOH ring.  Creation of the bladder flap, the residual omentum and bladder were mobilized from the anterior uterus and fibroids, ultimately down to the level of the KOH ring.  The uterine artery on the right side was skeletonized, cauterized and cut in the normal manner.  A similar procedure was performed on the left.  The colpotomy was made and the uterus, cervix, bilateral ovaries and tubes were amputated and delivered through the vagina.  Pedicles were inspected and excellent hemostasis was achieved.    The colpotomy at the vaginal cuff was closed with 0 Vicryl to close each apex and 0 V-Loc to close the midportion of the cuff in a running manner.  Irrigation was used and excellent hemostasis was achieved.  Intra-abdominal pressure was turned down to 5 mmHg with excellent hemostasis maintained.  Given some bleeding during the sentinel lymph node biopsy on the left, Arista was placed within the pelvic lymph node bed.  At this point in the procedure was completed.  Robotic instruments were removed under direct visulaization.    The bladder was then backfilled with 200 cc of sterile fluid.  Foley catheter was removed and cystoscopy was performed with findings noted above.    The robot was undocked. The fascia at the 10-12 mm port was closed with 0 Vicryl using the PMI fascial closure device under direct visualization. The  supraumbilical trocar was removed and the incision extended 8-10 cm with a scalpel. The incision was carried down to and through the fascia, with the abdomen insufflated, using monopolar electrocautery. The peritoneal incision was extended under direct visualization. The endocatch bag with the uterine specimen was delivered through the incision. The incision was then closed with running #1 looped PDS tied in the midline. The subcutaneous tissue was irrigated and hemostasis achieved. Exparel was injected for local anesthesia. The subcutaneous tissue was closed with  2-0 Vicyrl in running fashion.   The fascia at the 10-12 mm port was closed with 0 Vicryl on a UR-5 needle.  The subcuticular tissue of all incisions was closed with 4-0 Vicryl and the skin was closed with 4-0 Monocryl in a subcuticular manner.  Dermabond was applied.    The vagina was swabbed with  minimal bleeding noted. All sponge, lap and needle counts were correct x  3.   The patient was transferred to the recovery room in stable condition.  Eugene Garnet, MD

## 2023-01-04 NOTE — Anesthesia Postprocedure Evaluation (Signed)
Anesthesia Post Note  Patient: Tara Adams  Procedure(s) Performed: XI ROBOTIC ASSISTED TOTAL HYSTERECTOMY WITH BILATERAL SALPINGO OOPHORECTOMY; CYSTOSCOPY (Bilateral) SENTINEL NODE BIOPSY     Patient location during evaluation: PACU Anesthesia Type: General Level of consciousness: awake and alert Pain management: pain level controlled Vital Signs Assessment: post-procedure vital signs reviewed and stable Respiratory status: spontaneous breathing, nonlabored ventilation, respiratory function stable and patient connected to nasal cannula oxygen Cardiovascular status: blood pressure returned to baseline and stable Postop Assessment: no apparent nausea or vomiting Anesthetic complications: no   No notable events documented.  Last Vitals:  Vitals:   01/04/23 1400 01/04/23 1415  BP: (!) 156/92   Pulse: (!) 56   Resp: 17   Temp: 36.7 C   SpO2: 95% 98%    Last Pain:  Vitals:   01/04/23 1400  TempSrc:   PainSc: 0-No pain                 Sharlie Shreffler S

## 2023-01-05 ENCOUNTER — Other Ambulatory Visit: Payer: Self-pay | Admitting: Gynecologic Oncology

## 2023-01-05 ENCOUNTER — Telehealth: Payer: Self-pay | Admitting: *Deleted

## 2023-01-05 ENCOUNTER — Encounter (HOSPITAL_COMMUNITY): Payer: Self-pay | Admitting: Gynecologic Oncology

## 2023-01-05 DIAGNOSIS — D649 Anemia, unspecified: Secondary | ICD-10-CM

## 2023-01-05 DIAGNOSIS — C541 Malignant neoplasm of endometrium: Secondary | ICD-10-CM | POA: Diagnosis not present

## 2023-01-05 DIAGNOSIS — E876 Hypokalemia: Secondary | ICD-10-CM

## 2023-01-05 DIAGNOSIS — R7989 Other specified abnormal findings of blood chemistry: Secondary | ICD-10-CM

## 2023-01-05 LAB — BASIC METABOLIC PANEL
Anion gap: 8 (ref 5–15)
Anion gap: 9 (ref 5–15)
BUN: 24 mg/dL — ABNORMAL HIGH (ref 8–23)
BUN: 23 mg/dL (ref 8–23)
CO2: 24 mmol/L (ref 22–32)
CO2: 23 mmol/L (ref 22–32)
Calcium: 7.7 mg/dL — ABNORMAL LOW (ref 8.9–10.3)
Calcium: 7.5 mg/dL — ABNORMAL LOW (ref 8.9–10.3)
Chloride: 105 mmol/L (ref 98–111)
Chloride: 104 mmol/L (ref 98–111)
Creatinine, Ser: 1.17 mg/dL — ABNORMAL HIGH (ref 0.44–1.00)
Creatinine, Ser: 1.32 mg/dL — ABNORMAL HIGH (ref 0.44–1.00)
GFR, Estimated: 49 mL/min — ABNORMAL LOW (ref >60)
GFR, Estimated: 42 mL/min — ABNORMAL LOW (ref >60)
Glucose, Bld: 112 mg/dL — ABNORMAL HIGH (ref 70–99)
Glucose, Bld: 112 mg/dL — ABNORMAL HIGH (ref 70–99)
Potassium: 3 mmol/L — ABNORMAL LOW (ref 3.5–5.1)
Potassium: 3 mmol/L — ABNORMAL LOW (ref 3.5–5.1)
Sodium: 137 mmol/L (ref 135–145)
Sodium: 136 mmol/L (ref 135–145)

## 2023-01-05 LAB — CBC
HCT: 29.4 % — ABNORMAL LOW (ref 36.0–46.0)
Hemoglobin: 8.8 g/dL — ABNORMAL LOW (ref 12.0–15.0)
MCH: 23.6 pg — ABNORMAL LOW (ref 26.0–34.0)
MCHC: 29.9 g/dL — ABNORMAL LOW (ref 30.0–36.0)
MCV: 78.8 fL — ABNORMAL LOW (ref 80.0–100.0)
Platelets: 222 10*3/uL (ref 150–400)
RBC: 3.73 MIL/uL — ABNORMAL LOW (ref 3.87–5.11)
RDW: 15.3 % (ref 11.5–15.5)
WBC: 7.5 10*3/uL (ref 4.0–10.5)
nRBC: 0.3 % — ABNORMAL HIGH (ref 0.0–0.2)

## 2023-01-05 LAB — HEMOGLOBIN AND HEMATOCRIT, BLOOD
HCT: 28.8 % — ABNORMAL LOW (ref 36.0–46.0)
Hemoglobin: 8.7 g/dL — ABNORMAL LOW (ref 12.0–15.0)

## 2023-01-05 MED ORDER — POTASSIUM CHLORIDE CRYS ER 20 MEQ PO TBCR
20.0000 meq | EXTENDED_RELEASE_TABLET | Freq: Two times a day (BID) | ORAL | Status: DC
  Administered 2023-01-05: 20 meq via ORAL
  Filled 2023-01-05: qty 1

## 2023-01-05 MED ORDER — POTASSIUM CHLORIDE CRYS ER 10 MEQ PO TBCR
10.0000 meq | EXTENDED_RELEASE_TABLET | Freq: Two times a day (BID) | ORAL | 0 refills | Status: AC
Start: 2023-01-05 — End: ?

## 2023-01-05 MED ORDER — LOSARTAN POTASSIUM 50 MG PO TABS
100.0000 mg | ORAL_TABLET | Freq: Every day | ORAL | Status: DC
  Administered 2023-01-05: 100 mg via ORAL
  Filled 2023-01-05: qty 2

## 2023-01-05 NOTE — Discharge Summary (Signed)
Physician Discharge Summary  Patient ID: Tara Adams MRN: [id number] DOB/AGE: 02/19/1951 72 y.o.  Admit date: 01/04/2023 Discharge date: 01/05/2023  Admission Diagnoses: Endometrial cancer  Discharge Diagnoses:  Principal Problem:   Endometrial cancer Active Problems:   Bladder adhesions   Discharged Condition:  The patient is in good condition and stable for discharge.    Hospital Course: On 01/04/2023, the patient underwent the following: Procedure(s): XI ROBOTIC ASSISTED TOTAL HYSTERECTOMY WITH BILATERAL SALPINGO OOPHORECTOMY; CYSTOSCOPY SENTINEL NODE BIOPSY. The postoperative course was uneventful.  She was discharged to home on postoperative day 1 tolerating a regular diet, ambulating, voiding, pain minimal.   Consults: None  Significant Diagnostic Studies: None  Treatments: surgery: see above  Discharge Exam (am assessment): Blood pressure (!) 142/93, pulse 68, temperature 98.3 F (36.8 C), temperature source Oral, resp. rate 18, SpO2 97 %. General: alert, cooperative, and no distress Resp: clear to auscultation bilaterally Cardio: regular rate and rhythm, S1, S2 normal, no murmur, click, rub or gallop GI: incision: lap sites to the abdomen along with midline mini laparotomy incision all with dermabond, intact with no active drainage or erythema and abdomen soft, active bowel sounds, non tender Extremities: extremities normal, atraumatic, no cyanosis or edema  Disposition: Discharge disposition: 01-Home or Self Care       Discharge Instructions     Call MD for:  difficulty breathing, headache or visual disturbances   Complete by: As directed    Call MD for:  extreme fatigue   Complete by: As directed    Call MD for:  hives   Complete by: As directed    Call MD for:  persistant dizziness or light-headedness   Complete by: As directed    Call MD for:  persistant nausea and vomiting   Complete by: As directed    Call MD for:  redness, tenderness, or signs  of infection (pain, swelling, redness, odor or green/yellow discharge around incision site)   Complete by: As directed    Call MD for:  severe uncontrolled pain   Complete by: As directed    Call MD for:  temperature >100.4   Complete by: As directed    Diet - low sodium heart healthy   Complete by: As directed    Driving Restrictions   Complete by: As directed    No driving for 1 week(s).  Do not take narcotics and drive. You need to make sure your reaction time has returned.   Increase activity slowly   Complete by: As directed    Lifting restrictions   Complete by: As directed    No lifting greater than 10 lbs, pushing, pulling, straining for 6 weeks.   Sexual Activity Restrictions   Complete by: As directed    No sexual activity, nothing in the vagina, for 10-12 weeks.      Allergies as of 01/05/2023       Reactions   Codeine Itching        Medication List     STOP taking these medications    aspirin EC 81 MG tablet   FISH OIL PO   ibuprofen 200 MG tablet Commonly known as: ADVIL   norethindrone 5 MG tablet Commonly known as: AYGESTIN       TAKE these medications    atorvastatin 80 MG tablet Commonly known as: LIPITOR Take 1 tablet (80 mg total) by mouth daily.   butalbital-acetaminophen-caffeine 50-325-40 MG tablet Commonly known as: FIORICET Take 1 tablet by mouth as needed for headache or  migraine.   CALCIUM-VITAMIN D PO Take 1 tablet by mouth daily.   carvedilol 25 MG tablet Commonly known as: COREG Take 1 tablet (25 mg total) by mouth 2 (two) times daily.   cyanocobalamin 1000 MCG tablet Commonly known as: VITAMIN B12 Take 1,000 mcg by mouth daily.   ezetimibe 10 MG tablet Commonly known as: ZETIA Take 1 tablet (10 mg total) by mouth daily.   gabapentin 100 MG capsule Commonly known as: NEURONTIN Take 100 mg by mouth as needed (pain).   HAIR/SKIN/NAILS/BIOTIN PO Take 2 tablets by mouth daily.   hydrocortisone cream 0.5 % Apply 1  application topically as needed for itching (Eczema).   isosorbide mononitrate 60 MG 24 hr tablet Commonly known as: IMDUR Take 1.5 tablets (90 mg total) by mouth daily. What changed:  how much to take when to take this additional instructions   losartan 100 MG tablet Commonly known as: COZAAR Take 1 tablet (100 mg total) by mouth daily.   multivitamin with minerals Tabs tablet Take 1 tablet by mouth daily.   nitroGLYCERIN 0.4 MG SL tablet Commonly known as: NITROSTAT Place 0.4 mg under the tongue every 5 (five) minutes as needed for chest pain.   OVER THE COUNTER MEDICATION Take 1 tablet by mouth daily. Beets chew   prednisoLONE acetate 1 % ophthalmic suspension Commonly known as: PRED FORTE Place 1 drop into both eyes 4 (four) times daily.   PROBIOTIC PO Take 2 capsules by mouth daily.   senna-docusate 8.6-50 MG tablet Commonly known as: Senokot-S Take 2 tablets by mouth at bedtime. For AFTER surgery, do not take if having diarrhea What changed:  when to take this reasons to take this additional instructions   SYSTANE OP Place 1 drop into both eyes in the morning, at noon, in the evening, and at bedtime.   tiZANidine 4 MG tablet Commonly known as: ZANAFLEX Take 4 mg by mouth as needed for muscle spasms.   traMADol 50 MG tablet Commonly known as: ULTRAM Take 1 tablet (50 mg total) by mouth every 6 (six) hours as needed for severe pain. For AFTER surgery, do not take and drive   triamterene-hydrochlorothiazide 75-50 MG tablet Commonly known as: MAXZIDE Take 1 tablet by mouth daily.   vitamin C 1000 MG tablet Take 1,000 mg by mouth daily.   Vitamin D3 125 MCG (5000 UT) Caps Take 1 capsule by mouth daily.        Follow-up Information     Carver Fila, MD Follow up on 01/10/2023.   Specialty: Gynecologic Oncology Why: On 4/24 you will have a PHONE visit with Dr. Pricilla Holm to check in and discuss pathology. IN PERSON visit will be on 01/25/23 at 1:30pm  at the [facility]. Contact information: [address] [place] [address] [phone]         [facility] Follow up on 01/08/2023.   Specialty: Oncology Why: at 10:15 am at the [facility] for repeat labs Contact information: [address] [id number] [place] [address] [phone]                Greater than thirty minutes were spend for face to face discharge instructions and discharge orders/summary in EPIC.   Signed: Doylene Bode 01/05/2023, 1:07 PM

## 2023-01-05 NOTE — Plan of Care (Signed)
  Problem: Skin Integrity: Goal: Demonstration of wound healing without infection will improve Outcome: Progressing   Problem: Activity: Goal: Risk for activity intolerance will decrease Outcome: Progressing   Problem: Pain Managment: Goal: General experience of comfort will improve Outcome: Progressing

## 2023-01-05 NOTE — Telephone Encounter (Signed)
Patient is not due until October and that schedule is not released yet. Will hold off on scheduling for right now.

## 2023-01-05 NOTE — Progress Notes (Signed)
1 Day Post-Op Procedure(s) (LRB): XI ROBOTIC ASSISTED TOTAL HYSTERECTOMY WITH BILATERAL SALPINGO OOPHORECTOMY; CYSTOSCOPY (Bilateral) SENTINEL NODE BIOPSY (N/A)  Subjective: Patient reports doing well this am. Mild abdominal soreness. Ambulating without difficulty. Voiding. Tolerating liquids. No nausea or emesis reported. No flatus but feels her abdomen gurgling. No concerns voiced.  Objective: Vital signs in last 24 hours: Temp:  [97.5 F (36.4 C)-98.7 F (37.1 C)] 98.3 F (36.8 C) (04/19 0529) Pulse Rate:  [54-69] 68 (04/19 0529) Resp:  [13-20] 18 (04/19 0529) BP: (119-173)/(66-93) 142/93 (04/19 0529) SpO2:  [93 %-100 %] 97 % (04/19 0529) Last BM Date : 01/03/23  Intake/Output from previous day: 04/18 0701 - 04/19 0700 In: 2520.9 [P.O.:480; I.V.:1940.9; IV Piggyback:100] Out: 1325 [Urine:1225; Blood:100]  Physical Examination: General: alert, cooperative, and no distress Resp: clear to auscultation bilaterally Cardio: regular rate and rhythm, S1, S2 normal, no murmur, click, rub or gallop GI: incision: lap sites to the abdomen along with midline mini laparotomy incision all with dermabond, intact with no active drainage or erythema and abdomen soft, active bowel sounds, non tender Extremities: extremities normal, atraumatic, no cyanosis or edema  Labs: WBC/Hgb/Hct/Plts:  7.5/8.8/29.4/222 (04/19 0420) BUN/Cr/glu/ALT/AST/amyl/lip:  24/1.17/--/--/--/--/-- (04/19 0420)  Assessment: 72 y.o. s/p Procedure(s): XI ROBOTIC ASSISTED TOTAL HYSTERECTOMY WITH BILATERAL SALPINGO OOPHORECTOMY; CYSTOSCOPY SENTINEL NODE BIOPSY: stable Pain:  Pain is well-controlled on PRN medications.  Heme:Hgb 8.8 and Hct 29.4 this am. Plan for repeat later this am to assess for stability.  ID: WBC count 7.5. Given decadron intra-op and IV antibiotic.   CV: BP and HR overall stable.  GI:  Tolerating po: Yes. Antiemetics ordered if needed.  GU: Creatinine 1.17 this am. Adequate urine documented  overnight.    FEN: K+ 3.0 this am. Plan for replacement and recheck.  Prophylaxis: SCDs and lovenox ordered.  Plan: Repeat labs later this am Diet as tolerated IV to saline lock IS opened and given to pt Encourage increasing mobility If labs stable and meeting milestones later today, plan for discharge home   LOS: 0 days    Doylene Bode 01/05/2023, 7:33 AM

## 2023-01-05 NOTE — Telephone Encounter (Signed)
-----   Message from Doylene Bode, NP sent at 01/05/2023  3:24 PM EDT ----- The patient was just discharged from the hospital.  Please call her and let her know I sent a prescription for potassium to her pharmacy.  She can plan to take this twice daily over the weekend and we will recheck her level on Monday. She needs to push fluids over the weekend.

## 2023-01-05 NOTE — Telephone Encounter (Signed)
PC to patient, no answer, left VM - informed patient of message below, a prescription for potassium has been sent to her pharmacy, she is to take this twice a day over the weekend & drink extra fluids.  She has an appointment for repeat labs here on Monday, 01/08/23 at 10:15.  Instructed patient to contact this office with any questions/concerns, [phone].

## 2023-01-05 NOTE — TOC CM/SW Note (Signed)
Transition of Care (TOC) Screening Note  Patient Details  Name: Tara Adams Date of Birth: Apr 23, 1951  Transition of Care [facility]) CM/SW Contact:    Ewing Schlein, LCSW Phone Number: 01/05/2023, 8:44 AM  Transition of Care Department [facility]) has reviewed patient and no TOC needs have been identified at this time. We will continue to monitor patient advancement through interdisciplinary progression rounds. If new patient transition needs arise, please place a TOC consult.

## 2023-01-05 NOTE — Discharge Instructions (Addendum)
AFTER SURGERY INSTRUCTIONS   Return to work: 4-6 weeks if applicable  PLAN TO COME TO THE CANCER CENTER ON MONDAY FOR REPEAT LABS. YOUR KIDNEY FUNCTION WAS SLIGHTLY ELEVATED. YOU WILL NEED TO PUSH FLUIDS OVER THE WEEKEND AND AVOID USE OF IBUPROFEN, NSAIDS LIKE NAPROXEN/ALEVE.  YOUR POTASSIUM IS ALSO SLIGHTLY LOW. WE WILL SEND IN A FEW DAYS OF REPLACEMENT.  You can resume your fish oil in one week and your aspirin in 3 days.   Activity: 1. Be up and out of the bed during the day.  Take a nap if needed.  You may walk up steps but be careful and use the hand rail.  Stair climbing will tire you more than you think, you may need to stop part way and rest.    2. No lifting or straining for 6 weeks over 10 pounds. No pushing, pulling, straining for 6 weeks.   3. No driving for around 1 week(s).  Do not drive if you are taking narcotic pain medicine and make sure that your reaction time has returned.    4. You can shower as soon as the next day after surgery. Shower daily.  Use your regular soap and water (not directly on the incision) and pat your incision(s) dry afterwards; don't rub.  No tub baths or submerging your body in water until cleared by your surgeon. If you have the soap that was given to you by pre-surgical testing that was used before surgery, you do not need to use it afterwards because this can irritate your incisions.    5. No sexual activity and nothing in the vagina for 10-12 weeks.   6. You may experience a small amount of clear drainage from your incisions, which is normal.  If the drainage persists, increases, or changes color please call the office.   7. Do not use creams, lotions, or ointments such as neosporin on your incisions after surgery until advised by your surgeon because they can cause removal of the dermabond glue on your incisions.     8. You may experience vaginal spotting after surgery or around the 6-8 week mark from surgery when the stitches at the top of the  vagina begin to dissolve.  The spotting is normal but if you experience heavy bleeding, call our office.   9. Take Tylenol first for pain if you are able to take these medication and only use narcotic pain medication for severe pain not relieved by the Tylenol.  Monitor your Tylenol intake to a max of 4,000 mg in a 24 hour period.   Diet: 1. Low sodium Heart Healthy Diet is recommended but you are cleared to resume your normal (before surgery) diet after your procedure.   2. It is safe to use a laxative, such as Miralax or Colace, if you have difficulty moving your bowels. You have been prescribed Sennakot-S to take at bedtime every evening after surgery to keep bowel movements regular and to prevent constipation.     Wound Care: 1. Keep clean and dry.  Shower daily.   Reasons to call the Doctor: Fever - Oral temperature greater than 100.4 degrees Fahrenheit Foul-smelling vaginal discharge Difficulty urinating Nausea and vomiting Increased pain at the site of the incision that is unrelieved with pain medicine. Difficulty breathing with or without chest pain New calf pain especially if only on one side Sudden, continuing increased vaginal bleeding with or without clots.   Contacts: For questions or concerns you should contact:   Dr.  Eugene Garnet at [phone]   Warner Mccreedy, NP at [phone]   After Hours: call [phone] and have the GYN Oncologist paged/contacted (after 5 pm or on the weekends). You will speak with an after hours RN and let he or she know you have had surgery.   Messages sent via mychart are for non-urgent matters and are not responded to after hours so for urgent needs, please call the after hours number.

## 2023-01-05 NOTE — Plan of Care (Signed)
  Problem: Education: Goal: Knowledge of the prescribed therapeutic regimen will improve Outcome: Adequate for Discharge Goal: Understanding of sexual limitations or changes related to disease process or condition will improve Outcome: Adequate for Discharge Goal: Individualized Educational Video(s) Outcome: Adequate for Discharge   Problem: Self-Concept: Goal: Communication of feelings regarding changes in body function or appearance will improve Outcome: Adequate for Discharge   Problem: Skin Integrity: Goal: Demonstration of wound healing without infection will improve 01/05/2023 1436 by Delila Spence, LPN Outcome: Adequate for Discharge 01/05/2023 0745 by Delila Spence, LPN Outcome: Progressing   Problem: Education: Goal: Knowledge of General Education information will improve Description: Including pain rating scale, medication(s)/side effects and non-pharmacologic comfort measures Outcome: Adequate for Discharge   Problem: Health Behavior/Discharge Planning: Goal: Ability to manage health-related needs will improve Outcome: Adequate for Discharge   Problem: Clinical Measurements: Goal: Ability to maintain clinical measurements within normal limits will improve Outcome: Adequate for Discharge Goal: Will remain free from infection Outcome: Adequate for Discharge Goal: Diagnostic test results will improve Outcome: Adequate for Discharge Goal: Respiratory complications will improve Outcome: Adequate for Discharge Goal: Cardiovascular complication will be avoided Outcome: Adequate for Discharge   Problem: Activity: Goal: Risk for activity intolerance will decrease 01/05/2023 1436 by Alice Rieger A, LPN Outcome: Adequate for Discharge 01/05/2023 0745 by Delila Spence, LPN Outcome: Progressing   Problem: Nutrition: Goal: Adequate nutrition will be maintained Outcome: Adequate for Discharge   Problem: Coping: Goal: Level of anxiety will decrease Outcome:  Adequate for Discharge   Problem: Elimination: Goal: Will not experience complications related to bowel motility Outcome: Adequate for Discharge Goal: Will not experience complications related to urinary retention Outcome: Adequate for Discharge   Problem: Pain Managment: Goal: General experience of comfort will improve 01/05/2023 1436 by Delila Spence, LPN Outcome: Adequate for Discharge 01/05/2023 0745 by Delila Spence, LPN Outcome: Progressing   Problem: Safety: Goal: Ability to remain free from injury will improve Outcome: Adequate for Discharge   Problem: Skin Integrity: Goal: Risk for impaired skin integrity will decrease Outcome: Adequate for Discharge

## 2023-01-08 ENCOUNTER — Other Ambulatory Visit: Payer: Self-pay

## 2023-01-08 ENCOUNTER — Other Ambulatory Visit: Payer: Self-pay | Admitting: Gynecologic Oncology

## 2023-01-08 ENCOUNTER — Telehealth: Payer: Self-pay | Admitting: Surgery

## 2023-01-08 ENCOUNTER — Inpatient Hospital Stay: Payer: Medicare HMO | Attending: Gynecologic Oncology

## 2023-01-08 DIAGNOSIS — R600 Localized edema: Secondary | ICD-10-CM

## 2023-01-08 DIAGNOSIS — C541 Malignant neoplasm of endometrium: Secondary | ICD-10-CM | POA: Insufficient documentation

## 2023-01-08 DIAGNOSIS — E876 Hypokalemia: Secondary | ICD-10-CM

## 2023-01-08 DIAGNOSIS — R7989 Other specified abnormal findings of blood chemistry: Secondary | ICD-10-CM

## 2023-01-08 DIAGNOSIS — D649 Anemia, unspecified: Secondary | ICD-10-CM

## 2023-01-08 LAB — BASIC METABOLIC PANEL
Anion gap: 10 (ref 5–15)
BUN: 20 mg/dL (ref 8–23)
CO2: 21 mmol/L — ABNORMAL LOW (ref 22–32)
Calcium: 8.9 mg/dL (ref 8.9–10.3)
Chloride: 110 mmol/L (ref 98–111)
Creatinine, Ser: 1.04 mg/dL — ABNORMAL HIGH (ref 0.44–1.00)
GFR, Estimated: 56 mL/min — ABNORMAL LOW (ref >60)
Glucose, Bld: 90 mg/dL (ref 70–99)
Potassium: 3.5 mmol/L (ref 3.5–5.1)
Sodium: 141 mmol/L (ref 135–145)

## 2023-01-08 LAB — TYPE AND SCREEN: Unit division: 0

## 2023-01-08 LAB — BPAM RBC
Blood Product Expiration Date: 202405032359
Blood Product Expiration Date: 202405032359

## 2023-01-08 LAB — HEMOGLOBIN AND HEMATOCRIT (CANCER CENTER ONLY)
HCT: 31.9 % — ABNORMAL LOW (ref 36.0–46.0)
Hemoglobin: 10 g/dL — ABNORMAL LOW (ref 12.0–15.0)

## 2023-01-08 LAB — SURGICAL PATHOLOGY

## 2023-01-08 NOTE — Telephone Encounter (Signed)
Patient scheduled for doppler to assess for DVT. Scheduled for tomorrow at 1pm. Patient called and notified of date/time of doppler. Verbalized understanding and stated she would be there. Patient also informed of her lab results from today, that her potassium and kidney function are improving and her Hgb is stable. No other concerns at this time.

## 2023-01-08 NOTE — Telephone Encounter (Signed)
Spoke with Ms. Wolak today. She states she is eating, drinking and urinating well. She had a BM last night. She is taking senokot as prescribed and encouraged her to drink plenty of water. She denies fever or chills. Incisions are dry and intact. She rates her pain 2/10. Her pain is controlled with Tylenol.     Of note, patient states that since coming home from the hospital she has noticed swelling in her L foot that extends up ankle and small part of lower leg. Denies pain, redness, or warmth at the site. She states she has just started taking her Maxide again and thinks that will help along with keeping her leg elevated. Advised patient that Dr Pricilla Holm would be notified and our office would call her with any additional recommendations.   Instructed to call office with any fever, chills, purulent drainage, uncontrolled pain or any other questions or concerns. Patient verbalizes understanding.   Pt aware of post op appointments as well as the office number [phone] and after hours number [phone] to call if she has any questions or concerns

## 2023-01-08 NOTE — Telephone Encounter (Addendum)
Post op call attempt x2. LVM for callback.

## 2023-01-09 ENCOUNTER — Ambulatory Visit (HOSPITAL_COMMUNITY)
Admission: RE | Admit: 2023-01-09 | Discharge: 2023-01-09 | Disposition: A | Discharge status: Home / Self Care | Payer: Medicare HMO | Source: Ambulatory Visit | Attending: Gynecologic Oncology | Admitting: Gynecologic Oncology

## 2023-01-09 DIAGNOSIS — M7989 Other specified soft tissue disorders: Secondary | ICD-10-CM

## 2023-01-09 DIAGNOSIS — R600 Localized edema: Secondary | ICD-10-CM | POA: Diagnosis present

## 2023-01-10 ENCOUNTER — Encounter: Payer: Self-pay | Admitting: Gynecologic Oncology

## 2023-01-10 ENCOUNTER — Inpatient Hospital Stay (HOSPITAL_BASED_OUTPATIENT_CLINIC_OR_DEPARTMENT_OTHER): Payer: Medicare HMO | Admitting: Gynecologic Oncology

## 2023-01-10 ENCOUNTER — Encounter: Payer: Medicare HMO | Admitting: Gynecologic Oncology

## 2023-01-10 DIAGNOSIS — Z90710 Acquired absence of both cervix and uterus: Secondary | ICD-10-CM

## 2023-01-10 DIAGNOSIS — C541 Malignant neoplasm of endometrium: Secondary | ICD-10-CM

## 2023-01-10 DIAGNOSIS — Z90722 Acquired absence of ovaries, bilateral: Secondary | ICD-10-CM

## 2023-01-10 DIAGNOSIS — Z7189 Other specified counseling: Secondary | ICD-10-CM

## 2023-01-10 NOTE — Progress Notes (Signed)
Gynecologic Oncology Telehealth Note: Gyn-Onc  I connected with [name] on 01/10/23 at  4:20 PM EDT by telephone and verified that I am speaking with the correct person using two identifiers.  I discussed the limitations, risks, security and privacy concerns of performing an evaluation and management service by telemedicine and the availability of in-person appointments. I also discussed with the patient that there may be a patient responsible charge related to this service. The patient expressed understanding and agreed to proceed.  Other persons participating in the visit and their role in the encounter: none.  Patient's location: home Provider's location: [facility]  Reason for Visit: follow-up after surgery  Treatment History: 01/04/23: Robotic-assisted laparoscopic total hysterectomy with bilateral salpingoophorectomy, SLN biopsy, lysis of adhesions for approximately 30 minutes, mini-lap for specimen delivery (uterus > 250gms) , cystoscopy   Interval History: Doing well. Swelling has improved, thinks was taking in more sodium than normal. Denies any vaginal bleeding. Reports normal urinary and bowel function. Abdomen sore.  Past Medical/Surgical History: Past Medical History:  Diagnosis Date   Cancer    andometrial   Carotid artery occlusion    Coronary artery disease    a. ~ 2002 s/p CABG x 2 ([facility]); b. 03/2021 MV: Inferior, inferolateral, mid and apical anterior ischemia. EF 41%. High risk study.   Headache    migraines   Hyperlipidemia    Hypertension    Morbid obesity    Sciatica     Past Surgical History:  Procedure Laterality Date   BREAST BIOPSY Left 11/09/2022   pathology - no cancer   BREAST BIOPSY Left 11/09/2022   [place] LT BREAST BX W LOC DEV 1ST LESION IMG BX SPEC US GUIDE 11/09/2022 [facility]AMMO[facility]APHY   CATARACT EXTRACTION Bilateral 10/2022   CESAREAN SECTION     CORONARY ARTERY BYPASS GRAFT N/A    LEFT HEART CATH AND CORS/GRAFTS  ANGIOGRAPHY N/A 05/20/2021   Procedure: LEFT HEART CATH AND CORS/GRAFTS ANGIOGRAPHY;  Surgeon: Iran Ouch, MD;  Location: [facility] INVASIVE CV LAB;  Service: Cardiovascular;  Laterality: N/A;   ROBOTIC ASSISTED TOTAL HYSTERECTOMY WITH BILATERAL SALPINGO OOPHERECTOMY Bilateral 01/04/2023   Procedure: XI ROBOTIC ASSISTED TOTAL HYSTERECTOMY WITH BILATERAL SALPINGO OOPHORECTOMY; CYSTOSCOPY;  Surgeon: Carver Fila, MD;  Location: [facility] ORS;  Service: Gynecology;  Laterality: Bilateral;   SENTINEL NODE BIOPSY N/A 01/04/2023   Procedure: SENTINEL NODE BIOPSY;  Surgeon: Carver Fila, MD;  Location: [facility] ORS;  Service: Gynecology;  Laterality: N/A;    Family History  Problem Relation Age of Onset   Breast cancer Maternal Aunt    Colon cancer Neg Hx    Ovarian cancer Neg Hx    Endometrial cancer Neg Hx    Pancreatic cancer Neg Hx    Prostate cancer Neg Hx     Social History   Socioeconomic History   Marital status: Single    Spouse name: Not on file   Number of children: Not on file   Years of education: Not on file   Highest education level: Not on file  Occupational History   Occupation: works at [employer] at [employer]  Tobacco Use   Smoking status: Former    Types: Cigarettes    Quit date: 05/29/1981    Years since quitting: 41.6   Smokeless tobacco: Never  Vaping Use   Vaping Use: Never used  Substance and Sexual Activity   Alcohol use: Never   Drug use: Never   Sexual activity: Yes  Other Topics Concern  Not on file  Social History Narrative   Lives in [place].  Volunteers, driving elderly individuals.  Does not routinely exercise.   Social Determinants of Health   Financial Resource Strain: Not on file  Food Insecurity: Patient Declined (01/04/2023)   Hunger Vital Sign    Worried About Running Out of Food in the Last Year: Patient declined    Ran Out of Food in the Last Year: Patient declined  Transportation Needs: No Transportation Needs (01/04/2023)   PRAPARE  - Administrator, Civil Service (Medical): No    Lack of Transportation (Non-Medical): No  Physical Activity: Not on file  Stress: Not on file  Social Connections: Not on file    Current Medications:  Current Outpatient Medications:    Ascorbic Acid (VITAMIN C) 1000 MG tablet, Take 1,000 mg by mouth daily., Disp: , Rfl:    atorvastatin (LIPITOR) 80 MG tablet, Take 1 tablet (80 mg total) by mouth daily., Disp: 90 tablet, Rfl: 0   butalbital-acetaminophen-caffeine (FIORICET) 50-325-40 MG tablet, Take 1 tablet by mouth as needed for headache or migraine., Disp: , Rfl:    CALCIUM-VITAMIN D PO, Take 1 tablet by mouth daily., Disp: , Rfl:    carvedilol (COREG) 25 MG tablet, Take 1 tablet (25 mg total) by mouth 2 (two) times daily., Disp: 180 tablet, Rfl: 0   Cholecalciferol (VITAMIN D3) 125 MCG (5000 UT) CAPS, Take 1 capsule by mouth daily., Disp: , Rfl:    ezetimibe (ZETIA) 10 MG tablet, Take 1 tablet (10 mg total) by mouth daily., Disp: 90 tablet, Rfl: 3   gabapentin (NEURONTIN) 100 MG capsule, Take 100 mg by mouth as needed (pain)., Disp: , Rfl:    hydrocortisone cream 0.5 %, Apply 1 application topically as needed for itching (Eczema)., Disp: , Rfl:    isosorbide mononitrate (IMDUR) 60 MG 24 hr tablet, Take 1.5 tablets (90 mg total) by mouth daily. (Patient taking differently: Take 30-60 mg by mouth See admin instructions. Take 60 mg by mouth in the morning and 30 mg by mouth at bedtime.), Disp: 135 tablet, Rfl: 3   losartan (COZAAR) 100 MG tablet, Take 1 tablet (100 mg total) by mouth daily., Disp: 30 tablet, Rfl: 3   Multiple Vitamin (MULTIVITAMIN WITH MINERALS) TABS tablet, Take 1 tablet by mouth daily., Disp: , Rfl:    Multiple Vitamins-Minerals (HAIR/SKIN/NAILS/BIOTIN PO), Take 2 tablets by mouth daily., Disp: , Rfl:    nitroGLYCERIN (NITROSTAT) 0.4 MG SL tablet, Place 0.4 mg under the tongue every 5 (five) minutes as needed for chest pain., Disp: , Rfl:    OVER THE COUNTER  MEDICATION, Take 1 tablet by mouth daily. Beets chew, Disp: , Rfl:    Polyethyl Glycol-Propyl Glycol (SYSTANE OP), Place 1 drop into both eyes in the morning, at noon, in the evening, and at bedtime., Disp: , Rfl:    potassium chloride (KLOR-CON M) 10 MEQ tablet, Take 1 tablet (10 mEq total) by mouth 2 (two) times daily., Disp: 4 tablet, Rfl: 0   prednisoLONE acetate (PRED FORTE) 1 % ophthalmic suspension, Place 1 drop into both eyes 4 (four) times daily. (Patient not taking: Reported on 01/04/2023), Disp: , Rfl:    Probiotic Product (PROBIOTIC PO), Take 2 capsules by mouth daily., Disp: , Rfl:    senna-docusate (SENOKOT-S) 8.6-50 MG tablet, Take 2 tablets by mouth at bedtime. For AFTER surgery, do not take if having diarrhea (Patient taking differently: Take 2 tablets by mouth as needed for mild constipation or  moderate constipation.), Disp: 30 tablet, Rfl: 0   tiZANidine (ZANAFLEX) 4 MG tablet, Take 4 mg by mouth as needed for muscle spasms., Disp: , Rfl:    traMADol (ULTRAM) 50 MG tablet, Take 1 tablet (50 mg total) by mouth every 6 (six) hours as needed for severe pain. For AFTER surgery, do not take and drive (Patient not taking: Reported on 12/27/2022), Disp: 10 tablet, Rfl: 0   triamterene-hydrochlorothiazide (MAXZIDE) 75-50 MG tablet, Take 1 tablet by mouth daily., Disp: , Rfl:    vitamin B-12 (CYANOCOBALAMIN) 1000 MCG tablet, Take 1,000 mcg by mouth daily., Disp: , Rfl:   Review of Symptoms: Pertinent positives as per HPI.  Physical Exam: Deferred given limitations of phone visit.  Laboratory & Radiologic Studies: A. SENTINEL LYMPH NODE, RIGHT OBTURATOR, EXCISION: -  1 lymph node, negative for malignancy (0/1), serially sectioned and multiple levels examined.  B. SENTINEL LYMPH NODE, LEFT OBTURATOR, EXCISION: -  1 lymph node, negative for malignancy (0/1), serially sectioned and multiple levels examined.  C. UTERUS, CERVIX, BILATERAL FALLOPIAN TUBES AND OVARIES: -  Endometrioid  carcinoma with mucinous differentiation, FIGO grade 1 of 3. -  Extends 12 mm into the myometrium (myometrial thickness 20 mm) for overall percentage of myometrial invasion of 60%, FIGO stage  IB. -  Incidental benign leiomyomas of the myometrium -  Unremarkable bilateral fallopian tubes and ovaries (uninvolved by carcinoma). -  See synoptic report below for further information.  ONCOLOGY TABLE:  UTERUS, CARCINOMA: Resection  Procedure: Total hysterectomy and bilateral salpingo-oophorectomy Histologic Type: Endometrioid carcinoma with mucinous differentiation Histologic Grade: FIGO grade 1 of 3 Myometrial Invasion:      Depth of Myometrial Invasion (mm): 12      Myometrial Thickness (mm): 20      Percentage of Myometrial Invasion: 60% Uterine Serosa Involvement: Not identified Cervical stromal Involvement: Not identified Extent of involvement of other tissue/organs: Not identified Peritoneal/Ascitic Fluid: N/A Lymphovascular Invasion: Not identified Regional Lymph Nodes:      Pelvic Lymph Nodes Examined:          0 Sentinel          2 non-sentinel          2 total      Pelvic Lymph Nodes with Metastasis: 0          Macrometastasis: (>2.0 mm): N/A          Micrometastasis: (>0.2 mm and < 2.0 mm): N/A          Isolated Tumor Cells (<0.2 mm): N/A          Laterality of Lymph Node with Tumor: N/A          Extracapsular Extension: N/A      Para-aortic Lymph Nodes Examined:          0 Sentinel          0 non-sentinel          0 total      Para-aortic Lymph Nodes with Metastasis: N/A          Macrometastasis: (>2.0 mm): N/A          Micrometastasis:  (>0.2 mm and < 2.0 mm): N/A          Isolated Tumor Cells (<0.2 mm): N/A          Laterality of Lymph Node with Tumor: N/A          Extracapsular Extension: N/A Distant Metastasis:      Distant Site(s) Involved: N/A  Pathologic Stage Classification (pTNM, AJCC 8th Edition): pT1b, pN0; FIGO stage IB Ancillary Studies: ER positive,  p16 positive and retained nuclear expression of the mismatch repair proteins performed on previous outside biopsy Representative Tumor Block: C9 Comment(s): (v4.2.0.1)   Assessment & Plan: Tara Adams is a 72 y.o. 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  Doing well. Discussed continued expectations.  Discussed that LE doppler was negative (patient already aware).  Reviewed pathology. Given high-intermediate risk criteria (with age and MI), discussed recommendation for VBT after she has healed. I will ask Clydie Braun to coordinate consultation with Dr. Roselind Messier.  I discussed the assessment and treatment plan with the patient. The patient was provided with an opportunity to ask questions and all were answered. The patient agreed with the plan and demonstrated an understanding of the instructions.   The patient was advised to call back or see an in-person evaluation if the symptoms worsen or if the condition fails to improve as anticipated.   8 minutes of total time was spent for this patient encounter, including preparation, phone counseling with the patient and coordination of care, and documentation of the encounter.   Eugene Garnet, MD  Division of Gynecologic Oncology  Department of Obstetrics and Gynecology  [facility]

## 2023-01-22 ENCOUNTER — Other Ambulatory Visit: Payer: Self-pay | Admitting: Oncology

## 2023-01-22 DIAGNOSIS — C541 Malignant neoplasm of endometrium: Secondary | ICD-10-CM

## 2023-01-22 NOTE — Progress Notes (Signed)
Gynecologic Oncology Multi-Disciplinary Disposition Conference Note  Date of the Conference: 01/22/2023  Patient Name: Tara Adams  Referring Provider: Henreitta Leber, PA Primary GYN Oncologist: Dr. Pricilla Holm   Stage/Disposition:  Stage IB, grade 1. Disposition is to vaginal brachytherapy.   This Multidisciplinary conference took place involving physicians from Gynecologic Oncology, Medical Oncology, Radiation Oncology, Pathology, Radiology along with the Gynecologic Oncology Nurse Practitioner and Gynecologic Oncology Nurse Navigator.  Comprehensive assessment of the patient's malignancy, staging, need for surgery, chemotherapy, radiation therapy, and need for further testing were reviewed. Supportive measures, both inpatient and following discharge were also discussed. The recommended plan of care is documented. Greater than 35 minutes were spent correlating and coordinating this patient's care.

## 2023-01-24 ENCOUNTER — Telehealth: Payer: Self-pay | Admitting: Gynecologic Oncology

## 2023-01-25 ENCOUNTER — Other Ambulatory Visit: Payer: Self-pay

## 2023-01-25 ENCOUNTER — Encounter: Payer: Self-pay | Admitting: Oncology

## 2023-01-25 ENCOUNTER — Encounter: Payer: Self-pay | Admitting: Gynecologic Oncology

## 2023-01-25 ENCOUNTER — Inpatient Hospital Stay: Payer: Medicare HMO | Attending: Gynecologic Oncology | Admitting: Gynecologic Oncology

## 2023-01-25 ENCOUNTER — Telehealth: Payer: Medicare HMO | Admitting: Gynecologic Oncology

## 2023-01-25 VITALS — BP 115/66 | HR 68 | Temp 98.6°F | Ht 62.99 in | Wt 213.6 lb

## 2023-01-25 DIAGNOSIS — Z90722 Acquired absence of ovaries, bilateral: Secondary | ICD-10-CM

## 2023-01-25 DIAGNOSIS — C541 Malignant neoplasm of endometrium: Secondary | ICD-10-CM

## 2023-01-25 DIAGNOSIS — Z7189 Other specified counseling: Secondary | ICD-10-CM

## 2023-01-25 DIAGNOSIS — Z90710 Acquired absence of both cervix and uterus: Secondary | ICD-10-CM

## 2023-01-25 NOTE — Progress Notes (Signed)
Gynecologic Oncology Return Clinic Visit  01/25/23  Reason for Visit: follow-up after surgery, treatment planning  Treatment History: The patient presented on 10/23/22 with PMB. First episode occurred in December with second episode in January. Both episodes with light bleeding, first x3 days and second for 1 day.    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 MMR IHC intact.    Ultrasound in the office showed 9.1 x 6.1 x 5.8 cm with endometrial lining of 29 mm. Multiple fibroids measuring up to 4.4 cm.   01/04/23: Robotic-assisted laparoscopic total hysterectomy with bilateral salpingoophorectomy, SLN biopsy, lysis of adhesions for approximately 30 minutes, mini-lap for specimen delivery (uterus > 250gms) , cystoscopy   Interval History: Doing well.  Denies any vaginal bleeding or discharge.  Reports baseline bowel bladder function.  Denies any significant abdominal pain.  Past Medical/Surgical History: Past Medical History:  Diagnosis Date   Cancer (HCC)    andometrial   Carotid artery occlusion    Coronary artery disease    a. ~ 2002 s/p CABG x 2 ([facility]); b. 03/2021 MV: Inferior, inferolateral, mid and apical anterior ischemia. EF 41%. High risk study.   Headache    migraines   Hyperlipidemia    Hypertension    Morbid obesity (HCC)    Sciatica     Past Surgical History:  Procedure Laterality Date   BREAST BIOPSY Left 11/09/2022   pathology - no cancer   BREAST BIOPSY Left 11/09/2022   [place] LT BREAST BX W LOC DEV 1ST LESION IMG BX SPEC US GUIDE 11/09/2022 [facility]   CATARACT EXTRACTION Bilateral 10/2022   CESAREAN SECTION     CORONARY ARTERY BYPASS GRAFT N/A    LEFT HEART CATH AND CORS/GRAFTS ANGIOGRAPHY N/A 05/20/2021   Procedure: LEFT HEART CATH AND CORS/GRAFTS ANGIOGRAPHY;  Surgeon: Iran Ouch, MD;  Location: [facility] INV[facility]IVE CV [facility];  Service: Cardiovascular;  Laterality: N/A;   ROBOTIC ASSISTED TOTAL  HYSTERECTOMY WITH BILATERAL SALPINGO OOPHERECTOMY Bilateral 01/04/2023   Procedure: XI ROBOTIC ASSISTED TOTAL HYSTERECTOMY WITH BILATERAL SALPINGO OOPHORECTOMY; CYSTOSCOPY;  Surgeon: Carver Fila, MD;  Location: [facility];  Service: Gynecology;  Laterality: Bilateral;   SENTINEL NODE BIOPSY N/A 01/04/2023   Procedure: SENTINEL NODE BIOPSY;  Surgeon: Carver Fila, MD;  Location: [facility];  Service: Gynecology;  Laterality: N/A;    Family History  Problem Relation Age of Onset   Breast cancer Maternal Aunt    Colon cancer Neg Hx    Ovarian cancer Neg Hx    Endometrial cancer Neg Hx    Pancreatic cancer Neg Hx    Prostate cancer Neg Hx     Social History   Socioeconomic History   Marital status: Single    Spouse name: Not on file   Number of children: Not on file   Years of education: Not on file   Highest education level: Not on file  Occupational History   Occupation: works at [employer] at [employer]  Tobacco Use   Smoking status: Former    Types: Cigarettes    Quit date: 05/29/1981    Years since quitting: 41.6   Smokeless tobacco: Never  Vaping Use   Vaping Use: Never used  Substance and Sexual Activity   Alcohol use: Never   Drug use: Never   Sexual activity: Yes  Other Topics Concern   Not on file  Social History Narrative   Lives in [place].  Volunteers, driving elderly individuals.  Does not  routinely exercise.   Social Determinants of Health   Financial Resource Strain: Not on file  Food Insecurity: Patient Declined (01/04/2023)   Hunger Vital Sign    Worried About Running Out of Food in the Last Year: Patient declined    Ran Out of Food in the Last Year: Patient declined  Transportation Needs: No Transportation Needs (01/04/2023)   PRAPARE - Administrator, Civil Service (Medical): No    Lack of Transportation (Non-Medical): No  Physical Activity: Not on file  Stress: Not on file  Social Connections: Not on file    Current  Medications:  Current Outpatient Medications:    Ascorbic Acid (VITAMIN C) 1000 MG tablet, Take 1,000 mg by mouth daily., Disp: , Rfl:    atorvastatin (LIPITOR) 80 MG tablet, Take 1 tablet (80 mg total) by mouth daily., Disp: 90 tablet, Rfl: 0   butalbital-acetaminophen-caffeine (FIORICET) 50-325-40 MG tablet, Take 1 tablet by mouth as needed for headache or migraine., Disp: , Rfl:    CALCIUM-VITAMIN D PO, Take 1 tablet by mouth daily., Disp: , Rfl:    Cholecalciferol (VITAMIN D3) 125 MCG (5000 UT) CAPS, Take 1 capsule by mouth daily., Disp: , Rfl:    ezetimibe (ZETIA) 10 MG tablet, Take 1 tablet (10 mg total) by mouth daily., Disp: 90 tablet, Rfl: 3   gabapentin (NEURONTIN) 100 MG capsule, Take 100 mg by mouth as needed (pain)., Disp: , Rfl:    hydrocortisone cream 0.5 %, Apply 1 application topically as needed for itching (Eczema)., Disp: , Rfl:    isosorbide mononitrate (IMDUR) 60 MG 24 hr tablet, Take 1.5 tablets (90 mg total) by mouth daily. (Patient taking differently: Take 30-60 mg by mouth See admin instructions. Take 60 mg by mouth in the morning and 30 mg by mouth at bedtime.), Disp: 135 tablet, Rfl: 3   losartan (COZAAR) 100 MG tablet, Take 1 tablet (100 mg total) by mouth daily., Disp: 30 tablet, Rfl: 3   Multiple Vitamin (MULTIVITAMIN WITH MINERALS) TABS tablet, Take 1 tablet by mouth daily., Disp: , Rfl:    Multiple Vitamins-Minerals (HAIR/SKIN/NAILS/BIOTIN PO), Take 2 tablets by mouth daily., Disp: , Rfl:    nitroGLYCERIN (NITROSTAT) 0.4 MG SL tablet, Place 0.4 mg under the tongue every 5 (five) minutes as needed for chest pain., Disp: , Rfl:    Omega-3 Fatty Acids (FISH OIL) 300 MG CAPS, Take 300 mg by mouth once. Patient takes this over the counter once daily, Disp: , Rfl:    OVER THE COUNTER MEDICATION, Take 1 tablet by mouth daily. Beets chew, Disp: , Rfl:    Polyethyl Glycol-Propyl Glycol (SYSTANE OP), Place 1 drop into both eyes in the morning, at noon, in the evening, and at  bedtime., Disp: , Rfl:    potassium chloride (KLOR-CON M) 10 MEQ tablet, Take 1 tablet (10 mEq total) by mouth 2 (two) times daily., Disp: 4 tablet, Rfl: 0   Probiotic Product (PROBIOTIC PO), Take 2 capsules by mouth daily., Disp: , Rfl:    senna-docusate (SENOKOT-S) 8.6-50 MG tablet, Take 2 tablets by mouth at bedtime. For AFTER surgery, do not take if having diarrhea (Patient taking differently: Take 2 tablets by mouth as needed for mild constipation or moderate constipation.), Disp: 30 tablet, Rfl: 0   tiZANidine (ZANAFLEX) 4 MG tablet, Take 4 mg by mouth as needed for muscle spasms., Disp: , Rfl:    triamterene-hydrochlorothiazide (MAXZIDE) 75-50 MG tablet, Take 1 tablet by mouth daily., Disp: , Rfl:  vitamin B-12 (CYANOCOBALAMIN) 1000 MCG tablet, Take 1,000 mcg by mouth daily., Disp: , Rfl:    carvedilol (COREG) 25 MG tablet, Take 1 tablet (25 mg total) by mouth 2 (two) times daily., Disp: 180 tablet, Rfl: 0  Review of Systems: Denies appetite changes, fevers, chills, fatigue, unexplained weight changes. Denies hearing loss, neck lumps or masses, mouth sores, ringing in ears or voice changes. Denies cough or wheezing.  Denies shortness of breath. Denies chest pain or palpitations. Denies leg swelling. Denies abdominal distention, pain, blood in stools, constipation, diarrhea, nausea, vomiting, or early satiety. Denies pain with intercourse, dysuria, frequency, hematuria or incontinence. Denies hot flashes, pelvic pain, vaginal bleeding or vaginal discharge.   Denies joint pain, back pain or muscle pain/cramps. Denies itching, rash, or wounds. Denies dizziness, headaches, numbness or seizures. Denies swollen lymph nodes or glands, denies easy bruising or bleeding. Denies anxiety, depression, confusion, or decreased concentration.  Physical Exam: BP 115/66 (BP Location: Right Arm, Patient Position: Sitting)   Pulse 68   Temp 98.6 F (37 C) (Oral)   Ht 5' 2.99" (1.6 m)   Wt 213 lb  9.6 oz (96.9 kg)   SpO2 99%   BMI 37.85 kg/m  General: Alert, oriented, no acute distress. HEENT: Normocephalic, atraumatic, sclera anicteric. Chest: Unlabored breathing on room air. Abdomen: Obese, soft, nontender.  Normoactive bowel sounds.  No masses or hepatosplenomegaly appreciated.  Well-healed incisions.  Remaining Dermabond removed from mini lap. Extremities: Grossly normal range of motion.  Warm, well perfused.  No edema bilaterally. Skin: No rashes or lesions noted. Lymphatics: No cervical, supraclavicular, or inguinal adenopathy. GU: Normal appearing external genitalia without erythema, excoriation, or lesions.  Speculum exam reveals cuff intact, suture visible, no blood or discharge.  Bimanual exam reveals cuff intact, no fluctuance or tenderness to palpation.    Laboratory & Radiologic Studies: A. SENTINEL LYMPH NODE, RIGHT OBTURATOR, EXCISION:  -  1 lymph node, negative for malignancy (0/1), serially sectioned and  multiple levels examined.   B. SENTINEL LYMPH NODE, LEFT OBTURATOR, EXCISION:  -  1 lymph node, negative for malignancy (0/1), serially sectioned and  multiple levels examined.   C. UTERUS, CERVIX, BILATERAL FALLOPIAN TUBES AND OVARIES:  -  Endometrioid carcinoma with mucinous differentiation, FIGO grade 1 of  3.  - Extends 12 mm into the myometrium (myometrial thickness 20 mm) for  overall percentage of myometrial invasion of 60%, FIGO stage  IB.  -  Incidental benign leiomyomas of the myometrium  -  Unremarkable bilateral fallopian tubes and ovaries (uninvolved by  carcinoma).  - See synoptic report below for further information.   Assessment & Plan: Tara Adams is a 72 y.o. 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  The patient is doing well from a postoperative standpoint.  We discussed continued expectations and restrictions.  The patient was given a copy of her pathology report.  It does not  appear that MMR IHC nor p53 was performed.  My office has reached out to pathology about adding this.  In the setting of her high-intermediate risk criteria, we discussed recommendation for vaginal brachytherapy to decrease the risk of cancer recurrence at the vaginal cuff.  Patient has been scheduled to see Dr. Roselind Messier (5/20).  I will see her for follow-up when she has completed radiation.  22 minutes of total time was spent for this patient encounter, including preparation, face-to-face counseling with the patient and coordination of care, and documentation of the encounter.  Jeral Pinch, MD  Division of Gynecologic Oncology  Department of Obstetrics and Gynecology  [facility]

## 2023-01-25 NOTE — Progress Notes (Signed)
Requested MMR and P53 on accession [id number] with [facility] via email.

## 2023-01-25 NOTE — Patient Instructions (Addendum)
It was good to see you today.   Please remember, no heavy lifting for 6 weeks after surgery and nothing in the vagina for 10 weeks.  I will see you back once you have finished your vaginal radiation.  As always, if you develop any new and concerning symptoms before your next visit, please call to see me sooner.

## 2023-01-26 ENCOUNTER — Telehealth: Payer: Self-pay | Admitting: Gynecologic Oncology

## 2023-01-26 NOTE — Telephone Encounter (Signed)
Confirmed MMR and p53 performed on initial biopsy. MMRp, p53 WT.

## 2023-02-02 ENCOUNTER — Telehealth: Payer: Self-pay

## 2023-02-02 NOTE — Telephone Encounter (Signed)
Rn attempted to call pt to obtain meaningful use and nurse evaluation information without success. Voicemail was left for pt. Rn will attempt to call back later.

## 2023-02-04 NOTE — Progress Notes (Signed)
Radiation Oncology         [phone] ________________________________  Initial Outpatient Consultation  Name: Tara Adams MRN: [id number]  Date: 02/05/2023  DOB: [birth year]  [id number], Doralee Albino, MD  Carver Fila, MD   REFERRING PHYSICIAN: Carver Fila, MD  DIAGNOSIS: The encounter diagnosis was Endometrial cancer [facility]).  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 female who is seen as a courtesy of Dr. Pricilla Holm for an opinion concerning radiation therapy as part of management for her recently diagnosed endometrial cancer. The patient presented to medical attention on 10/23/22 with postmenopausal bleeding. Per encounter notes, her first episode of bleeding occurred in December and a second one in January.   She subsequently underwent an endometrial biopsy on 11/13/22 which revealed grade 1 endometrioid endometrial adenocarcinoma; P53 wildtype; MMR intact.   Pelvic US performed at an outside facility on an unknown date (report per Dr. Pricilla Holm) showed a 9.1 x 6.1 x 5.8 cm with an endometrial lining of 29 mm. Multiple fibroids were also appreciated measuring up to 4.4 cm.    Accordingly, the patient was referred to Dr. Pricilla Holm on 12/14/22 for further management. During this visit, the patient reported having several episodes of rare spotting which began sevreal year prior. She also reported 1 episode of heavier bleeding after being started on aspirin and Plavix about 2 years ago. This resolved when she came off of Plavix and she also stopped her aspirin for a brief period of time. Since then, she reported having rare episodes of light spotting, which was enough to require pads. She also reported having some associated cramping with the spotting.   Per Dr. Winferd Humphrey recommendation, the patient opted to proceed with a total hysterectomy, BSO, and nodal biopsies on  01/04/23. Pathology from the procedure revealed: FIGO grade 1 endometrioid carcinoma with mucinous differentiation; 60% myometrial invasion; nodal status of 2/2 pelvic lymph nodes negative for malignancy (consisting of 1 right and 1 left obturator lymph node).   Based on her high-intermediate risk factors, Dr. Pricilla Holm recommends vaginal brachytherapy. The patient's case was also presented at the Gyn-Onc tumor board on 01/22/23. Disposition concluded is also to vaginal brachytherapy which we will discuss in detail today.   Of note: The patient also underwent work up of left breast after a screening mammogram performed this past February showed a possible mass in the left breast. Left breast diagnostic mammogram and left breast ultrasound on 11/03/22 demonstrated an indeterminate mass in the 3 o'clock left breast. No other abnormalities in the left breast or axilla were appreciated. Biopsy of the 3 o'clock left breast on 11/09/22 showed benign findings.      PREVIOUS RADIATION THERAPY: No  PAST MEDICAL HISTORY:  Past Medical History:  Diagnosis Date   Cancer (HCC)    andometrial   Carotid artery occlusion    Coronary artery disease    a. ~ 2002 s/p CABG x 2 ([facility]); b. 03/2021 MV: Inferior, inferolateral, mid and apical anterior ischemia. EF 41%. High risk study.   Headache    migraines   Hyperlipidemia    Hypertension    Morbid obesity (HCC)    Sciatica     PAST SURGICAL HISTORY: Past Surgical History:  Procedure Laterality Date   BREAST BIOPSY Left 11/09/2022   pathology - no cancer   BREAST BIOPSY Left 11/09/2022   [place] LT BREAST BX W LOC DEV  1ST LESION IMG BX SPEC US GUIDE 11/09/2022 [facility] MAMMOGRAPHY   CATARACT EXTRACTION Bilateral 10/2022   CESAREAN SECTION     CORONARY ARTERY BYPASS GRAFT N/A    LEFT HEART CATH AND CORS/GRAFTS ANGIOGRAPHY N/A 05/20/2021   Procedure: LEFT HEART CATH AND CORS/GRAFTS ANGIOGRAPHY;  Surgeon: Iran Ouch, MD;  Location: [facility]  INVASIVE CV LAB;  Service: Cardiovascular;  Laterality: N/A;   ROBOTIC ASSISTED TOTAL HYSTERECTOMY WITH BILATERAL SALPINGO OOPHERECTOMY Bilateral 01/04/2023   Procedure: XI ROBOTIC ASSISTED TOTAL HYSTERECTOMY WITH BILATERAL SALPINGO OOPHORECTOMY; CYSTOSCOPY;  Surgeon: Carver Fila, MD;  Location: [facility];  Service: Gynecology;  Laterality: Bilateral;   SENTINEL NODE BIOPSY N/A 01/04/2023   Procedure: SENTINEL NODE BIOPSY;  Surgeon: Carver Fila, MD;  Location: [facility];  Service: Gynecology;  Laterality: N/A;    FAMILY HISTORY:  Family History  Problem Relation Age of Onset   Breast cancer Maternal Aunt    Colon cancer Neg Hx    Ovarian cancer Neg Hx    Endometrial cancer Neg Hx    Pancreatic cancer Neg Hx    Prostate cancer Neg Hx     SOCIAL HISTORY:  Social History   Tobacco Use   Smoking status: Former    Types: Cigarettes    Quit date: 05/29/1981    Years since quitting: 41.7   Smokeless tobacco: Never  Vaping Use   Vaping Use: Never used  Substance Use Topics   Alcohol use: Never   Drug use: Never    ALLERGIES:  Allergies  Allergen Reactions   Codeine Itching    MEDICATIONS:  Current Outpatient Medications  Medication Sig Dispense Refill   Ascorbic Acid (VITAMIN C) 1000 MG tablet Take 1,000 mg by mouth daily.     atorvastatin (LIPITOR) 80 MG tablet Take 1 tablet (80 mg total) by mouth daily. 90 tablet 0   butalbital-acetaminophen-caffeine (FIORICET) 50-325-40 MG tablet Take 1 tablet by mouth as needed for headache or migraine.     CALCIUM-VITAMIN D PO Take 1 tablet by mouth daily.     Cholecalciferol (VITAMIN D3) 125 MCG (5000 UT) CAPS Take 1 capsule by mouth daily.     ezetimibe (ZETIA) 10 MG tablet Take 1 tablet (10 mg total) by mouth daily. 90 tablet 3   gabapentin (NEURONTIN) 100 MG capsule Take 100 mg by mouth as needed (pain).     hydrocortisone cream 0.5 % Apply 1 application topically as needed for itching (Eczema).     isosorbide mononitrate  (IMDUR) 60 MG 24 hr tablet Take 1.5 tablets (90 mg total) by mouth daily. (Patient taking differently: Take 30-60 mg by mouth See admin instructions. Take 60 mg by mouth in the morning and 30 mg by mouth at bedtime.) 135 tablet 3   losartan (COZAAR) 100 MG tablet Take 1 tablet (100 mg total) by mouth daily. 30 tablet 3   Multiple Vitamin (MULTIVITAMIN WITH MINERALS) TABS tablet Take 1 tablet by mouth daily.     Multiple Vitamins-Minerals (HAIR/SKIN/NAILS/BIOTIN PO) Take 2 tablets by mouth daily.     nitroGLYCERIN (NITROSTAT) 0.4 MG SL tablet Place 0.4 mg under the tongue every 5 (five) minutes as needed for chest pain.     Omega-3 Fatty Acids (FISH OIL) 300 MG CAPS Take 300 mg by mouth once. Patient takes this over the counter once daily     OVER THE COUNTER MEDICATION Take 1 tablet by mouth daily. Beets chew     Polyethyl Glycol-Propyl Glycol (SYSTANE OP) Place 1 drop into  both eyes in the morning, at noon, in the evening, and at bedtime.     potassium chloride (KLOR-CON M) 10 MEQ tablet Take 1 tablet (10 mEq total) by mouth 2 (two) times daily. 4 tablet 0   Probiotic Product (PROBIOTIC PO) Take 2 capsules by mouth daily.     senna-docusate (SENOKOT-S) 8.6-50 MG tablet Take 2 tablets by mouth at bedtime. For AFTER surgery, do not take if having diarrhea (Patient taking differently: Take 2 tablets by mouth as needed for mild constipation or moderate constipation.) 30 tablet 0   tiZANidine (ZANAFLEX) 4 MG tablet Take 4 mg by mouth as needed for muscle spasms.     triamterene-hydrochlorothiazide (MAXZIDE) 75-50 MG tablet Take 1 tablet by mouth daily.     vitamin B-12 (CYANOCOBALAMIN) 1000 MCG tablet Take 1,000 mcg by mouth daily.     carvedilol (COREG) 25 MG tablet Take 1 tablet (25 mg total) by mouth 2 (two) times daily. 180 tablet 0   No current facility-administered medications for this encounter.    REVIEW OF SYSTEMS:  A 10+ POINT REVIEW OF SYSTEMS WAS OBTAINED including neurology, dermatology,  psychiatry, cardiac, respiratory, lymph, extremities, GI, GU, musculoskeletal, constitutional, reproductive, HEENT.  On evaluation today the patient denies any pain within the pelvis area.  She denies any problems with abdominal bloating vaginal bleeding or discharge.  She denies any urinary symptoms or bowel complaints since her surgery.   PHYSICAL EXAM:  weight is 204 lb 6.4 oz (92.7 kg). Her temperature is 97.5 F (36.4 C) (abnormal). Her blood pressure is 163/79 (abnormal) and her pulse is 66. Her respiration is 20 and oxygen saturation is 100%.   General: Alert and oriented, in no acute distress HEENT: Head is normocephalic. Extraocular movements are intact.  Neck: Neck is supple, no palpable cervical or supraclavicular lymphadenopathy. Heart: Regular in rate and rhythm with no murmurs, rubs, or gallops. Chest: Clear to auscultation bilaterally, with no rhonchi, wheezes, or rales. Abdomen: Soft, nontender, nondistended, with no rigidity or guarding. Extremities: No cyanosis or edema. Lymphatics: see Neck Exam Skin: No concerning lesions. Musculoskeletal: symmetric strength and muscle tone throughout. Neurologic: Cranial nerves II through XII are grossly intact. No obvious focalities. Speech is fluent. Coordination is intact. Psychiatric: Judgment and insight are intact. Affect is appropriate. Pelvic exam deferred in light of recent surgery.  Will be performed at the time of her brachytherapy planning and treatment.    ECOG = 1  0 - Asymptomatic (Fully active, able to carry on all predisease activities without restriction)  1 - Symptomatic but completely ambulatory (Restricted in physically strenuous activity but ambulatory and able to carry out work of a light or sedentary nature. For example, light housework, office work)  2 - Symptomatic, <50% in bed during the day (Ambulatory and capable of all self care but unable to carry out any work activities. Up and about more than 50% of  waking hours)  3 - Symptomatic, >50% in bed, but not bedbound (Capable of only limited self-care, confined to bed or chair 50% or more of waking hours)  4 - Bedbound (Completely disabled. Cannot carry on any self-care. Totally confined to bed or chair)  5 - Death   Santiago Glad MM, Creech RH, Tormey DC, et al. [phone]). "Toxicity and response criteria of the [facility]". Am. Evlyn Clines. Oncol. 5 (6): 649-55  LABORATORY DATA:  Lab Results  Component Value Date   WBC 7.5 01/05/2023   HGB 10.0 (L) 01/08/2023   HCT  31.9 (L) 01/08/2023   MCV 78.8 (L) 01/05/2023   PLT 222 01/05/2023   Lab Results  Component Value Date   NA 141 01/08/2023   K 3.5 01/08/2023   CL 110 01/08/2023   CO2 21 (L) 01/08/2023   GLUCOSE 90 01/08/2023   BUN 20 01/08/2023   CREATININE 1.04 (H) 01/08/2023   CALCIUM 8.9 01/08/2023      RADIOGRAPHY: VAS [place] LOWER EXTREMITY VENOUS (DVT)  Result Date: 01/09/2023  Lower Venous DVT Study Patient Name:  AMARIAH PANGELINAN  Date of Exam:   01/09/2023 Medical Rec #: [id number]         Accession #:    [id number] Date of Birth: 1950-10-30          Patient Gender: F Patient Age:   15 years Exam Location:  [facility] Procedure:      VAS [place] LOWER EXTREMITY VENOUS (DVT) Referring Phys: MELISSA CROSS --------------------------------------------------------------------------------  Indications: Swelling.  Risk Factors: Surgery total hysterectomy on 01/04/2023. Comparison Study: No previous exams Performing Technologist: Jody Hill RVT, RDMS  Examination Guidelines: A complete evaluation includes B-mode imaging, spectral Doppler, color Doppler, and power Doppler as needed of all accessible portions of each vessel. Bilateral testing is considered an integral part of a complete examination. Limited examinations for reoccurring indications may be performed as noted. The reflux portion of the exam is performed with the patient in reverse Trendelenburg.   +-----+---------------+---------+-----------+----------+--------------+ RIGHTCompressibilityPhasicitySpontaneityPropertiesThrombus Aging +-----+---------------+---------+-----------+----------+--------------+ CFV  Full           Yes      Yes                                 +-----+---------------+---------+-----------+----------+--------------+   +---------+---------------+---------+-----------+----------+-------------------+ LEFT     CompressibilityPhasicitySpontaneityPropertiesThrombus Aging      +---------+---------------+---------+-----------+----------+-------------------+ CFV      Full           Yes      Yes                                      +---------+---------------+---------+-----------+----------+-------------------+ SFJ      Full                                                             +---------+---------------+---------+-----------+----------+-------------------+ FV Prox  Full           Yes      Yes                                      +---------+---------------+---------+-----------+----------+-------------------+ FV Mid   Full           Yes      Yes                                      +---------+---------------+---------+-----------+----------+-------------------+ FV DistalFull           Yes      Yes                                      +---------+---------------+---------+-----------+----------+-------------------+  PFV      Full                                                             +---------+---------------+---------+-----------+----------+-------------------+ POP      Full           Yes      Yes                                      +---------+---------------+---------+-----------+----------+-------------------+ PTV      Full                                         Not well visualized +---------+---------------+---------+-----------+----------+-------------------+ PERO     Full                                                              +---------+---------------+---------+-----------+----------+-------------------+    Summary: RIGHT: - No evidence of common femoral vein obstruction.  LEFT: - There is no evidence of deep vein thrombosis in the lower extremity.  - No cystic structure found in the popliteal fossa.  *See table(s) above for measurements and observations. Electronically signed by Coral Else MD on 01/09/2023 at 7:27:47 PM.    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  Today, I talked to the patient  about the findings and work-up thus far.  We discussed the natural history of endometrial cancer and general treatment, highlighting the role of radiotherapy in the management.  We discussed the available radiation techniques, and focused on the details of logistics and delivery.  We reviewed the anticipated acute and late sequelae associated with radiation in this setting.  The patient was encouraged to ask questions that I answered to the best of my ability.  A patient consent form was discussed and signed.  We retained a copy for our records.  The patient would like to proceed with radiation and will be scheduled for CT simulation.  PLAN: She will be scheduled for initiation of brachytherapy approximately 8 weeks postop.  Anticipate 5 high-dose-rate treatments directed at the vaginal cuff.  Iridium 192 will be the high-dose-rate source.   60 minutes of total time was spent for this patient encounter, including preparation, face-to-face counseling with the patient and coordination of care, physical exam, and documentation of the encounter.   ------------------------------------------------  Billie Lade, PhD, MD  This document  serves as a record of services personally performed by Antony Blackbird, MD. It was created on his behalf by Neena Rhymes, a trained medical scribe. The creation of this record is based on the scribe's personal observations and the provider's statements to them. This document has been checked  and approved by the attending provider.

## 2023-02-05 ENCOUNTER — Encounter: Payer: Self-pay | Admitting: Radiation Oncology

## 2023-02-05 ENCOUNTER — Ambulatory Visit
Admission: RE | Admit: 2023-02-05 | Discharge: 2023-02-05 | Disposition: A | Discharge status: Home / Self Care | Payer: Medicare HMO | Source: Ambulatory Visit | Attending: Radiation Oncology | Admitting: Radiation Oncology

## 2023-02-05 ENCOUNTER — Other Ambulatory Visit: Payer: Self-pay

## 2023-02-05 VITALS — BP 163/79 | HR 66 | Temp 97.5°F | Resp 20 | Wt 204.4 lb

## 2023-02-05 DIAGNOSIS — C541 Malignant neoplasm of endometrium: Secondary | ICD-10-CM

## 2023-02-05 DIAGNOSIS — Z800 Family history of malignant neoplasm of digestive organs: Secondary | ICD-10-CM | POA: Diagnosis not present

## 2023-02-05 DIAGNOSIS — Z79899 Other long term (current) drug therapy: Secondary | ICD-10-CM | POA: Diagnosis not present

## 2023-02-05 DIAGNOSIS — I2510 Atherosclerotic heart disease of native coronary artery without angina pectoris: Secondary | ICD-10-CM | POA: Insufficient documentation

## 2023-02-05 DIAGNOSIS — Z90710 Acquired absence of both cervix and uterus: Secondary | ICD-10-CM | POA: Diagnosis not present

## 2023-02-05 DIAGNOSIS — I10 Essential (primary) hypertension: Secondary | ICD-10-CM | POA: Insufficient documentation

## 2023-02-05 DIAGNOSIS — E669 Obesity, unspecified: Secondary | ICD-10-CM | POA: Diagnosis not present

## 2023-02-05 DIAGNOSIS — Z803 Family history of malignant neoplasm of breast: Secondary | ICD-10-CM | POA: Diagnosis not present

## 2023-02-05 DIAGNOSIS — E785 Hyperlipidemia, unspecified: Secondary | ICD-10-CM | POA: Diagnosis not present

## 2023-02-05 DIAGNOSIS — Z87891 Personal history of nicotine dependence: Secondary | ICD-10-CM | POA: Insufficient documentation

## 2023-02-05 DIAGNOSIS — Z90722 Acquired absence of ovaries, bilateral: Secondary | ICD-10-CM | POA: Insufficient documentation

## 2023-02-05 NOTE — Progress Notes (Signed)
GYN Location of Tumor / Histology: Endometrial  Tara Adams presented with symptoms of: bleeding (Per Dr. Winferd Humphrey note on 12-14-22) The patient presented on 2/5 with PMB. First episode occurred in December with second episode in January. Both episodes with light bleeding, first x3 days and second for 1 day.   Biopsies revealed:  01-04-23 FINAL MICROSCOPIC DIAGNOSIS:  A. SENTINEL LYMPH NODE, RIGHT OBTURATOR, EXCISION: -  1 lymph node, negative for malignancy (0/1), serially sectioned and multiple levels examined.  B. SENTINEL LYMPH NODE, LEFT OBTURATOR, EXCISION: -  1 lymph node, negative for malignancy (0/1), serially sectioned and multiple levels examined.  C. UTERUS, CERVIX, BILATERAL FALLOPIAN TUBES AND OVARIES: -  Endometrioid carcinoma with mucinous differentiation, FIGO grade 1 of 3. -  Extends 12 mm into the myometrium (myometrial thickness 20 mm) for overall percentage of myometrial invasion of 60%, FIGO stage  IB. -  Incidental benign leiomyomas of the myometrium -  Unremarkable bilateral fallopian tubes and ovaries (uninvolved by carcinoma). -  See synoptic report below for further information.  ONCOLOGY TABLE:  UTERUS, CARCINOMA: Resection  Procedure: Total hysterectomy and bilateral salpingo-oophorectomy Histologic Type: Endometrioid carcinoma with mucinous differentiation Histologic Grade: FIGO grade 1 of 3 Myometrial Invasion:      Depth of Myometrial Invasion (mm): 12      Myometrial Thickness (mm): 20      Percentage of Myometrial Invasion: 60% Uterine Serosa Involvement: Not identified Cervical stromal Involvement: Not identified Extent of involvement of other tissue/organs: Not identified Peritoneal/Ascitic Fluid: N/A Lymphovascular Invasion: Not identified Regional Lymph Nodes:      Pelvic Lymph Nodes Examined:          0 Sentinel          2 non-sentinel          2 total      Pelvic Lymph Nodes with Metastasis: 0          Macrometastasis: (>2.0  mm): N/A          Micrometastasis: (>0.2 mm and < 2.0 mm): N/A          Isolated Tumor Cells (<0.2 mm): N/A          Laterality of Lymph Node with Tumor: N/A          Extracapsular Extension: N/A      Para-aortic Lymph Nodes Examined:          0 Sentinel          0 non-sentinel          0 total      Para-aortic Lymph Nodes with Metastasis: N/A          Macrometastasis: (>2.0 mm): N/A          Micrometastasis:  (>0.2 mm and < 2.0 mm): N/A          Isolated Tumor Cells (<0.2 mm): N/A          Laterality of Lymph Node with Tumor: N/A          Extracapsular Extension: N/A Distant Metastasis:      Distant Site(s) Involved: N/A Pathologic Stage Classification (pTNM, AJCC 8th Edition): pT1b, pN0; FIGO stage IB Ancillary Studies: ER positive, p16 positive and retained nuclear expression of the mismatch repair proteins performed on previous outside biopsy Representative Tumor Block: C9 Comment(s): (v4.2.0.1)  Past/Anticipated interventions by Gyn/Onc surgery, if any:  Dr. Pricilla Holm on 12-14-22 Assessment/Plan:  Postmenopausal patient with clinical stage I low-grade endometrioid adenocarcinoma.   We reviewed the nature  of endometrial cancer and its recommended surgical staging, including total hysterectomy, bilateral salpingo-oophorectomy, and lymph node assessment. The patient is a suitable candidate for staging via a minimally invasive approach to surgery.  We reviewed that robotic assistance would be used to complete the surgery.    We discussed that most endometrial cancer is detected early and that decisions regarding adjuvant therapy will be made based on her final pathology.    We reviewed the sentinel lymph node technique. Risks and benefits of sentinel lymph node biopsy was reviewed. We reviewed the technique and ICG dye. The patient DOES NOT have an iodine allergy or known liver dysfunction. We reviewed the false negative rate (0.4%), and that 3% of patients with metastatic disease  will not have it detected by SLN biopsy in endometrial cancer. A low risk of allergic reaction to the dye, <0.2% for ICG, has been reported. We also discussed that in the case of failed mapping, which occurs 40% of the time, a bilateral or unilateral lymphadenectomy will be performed at the surgeon's discretion.   01-04-23 Pre-operative Diagnosis: endometrial cancer grade 1   Post-operative Diagnosis: same, pelvic adhesive disease, enlarged fibroid uterus   Operation: Robotic-assisted laparoscopic total hysterectomy with bilateral salpingoophorectomy, SLN biopsy, lysis of adhesions for approximately 30 minutes, mini-lap for specimen delivery (uterus > 250gms) , cystoscopy    Surgeon: Eugene Garnet MD  Dr. Pricilla Holm 01-25-23 Treatment History: The patient presented on 10/23/22 with PMB. First episode occurred in December with second episode in January. Both episodes with light bleeding, first x3 days and second for 1 day.    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 MMR IHC intact.    Ultrasound in the office showed 9.1 x 6.1 x 5.8 cm with endometrial lining of 29 mm. Multiple fibroids measuring up to 4.4 cm.    01/04/23: Robotic-assisted laparoscopic total hysterectomy with bilateral salpingoophorectomy, SLN biopsy, lysis of adhesions for approximately 30 minutes, mini-lap for specimen delivery (uterus > 250gms) , cystoscopy   Assessment & Plan: Tara Adams is a 72 y.o. 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   The patient is doing well from a postoperative standpoint.  We discussed continued expectations and restrictions.   The patient was given a copy of her pathology report.  It does not appear that MMR IHC nor p53 was performed.  My office has reached out to pathology about adding this.  In the setting of her high-intermediate risk criteria, we discussed recommendation for vaginal  brachytherapy to decrease the risk of cancer recurrence at the vaginal cuff.  Patient has been scheduled to see Dr. Roselind Messier (5/20).  I will see her for follow-up when she has completed radiation.    Past/Anticipated interventions by medical oncology, if any: none in EMR  Weight changes, if any: None   Bowel/Bladder complaints, if any: No.,   Nausea/Vomiting, if any: no  Pain issues, if any:  no  SAFETY ISSUES: Prior radiation? no Pacemaker/ICD? no Possible current pregnancy? No, hysterectomy Is the patient on methotrexate? no  Current Complaints / other details:    Vitals:   02/05/23 1251  BP: (!) 163/79  Pulse: 66  Resp: 20  Temp: (!) 97.5 F (36.4 C)  SpO2: 100%  Weight: 92.7 kg   States that she has not taking her blood pressure medication today. Denies any s/s of of hypertension.

## 2023-02-07 ENCOUNTER — Telehealth: Payer: Self-pay | Admitting: *Deleted

## 2023-02-07 NOTE — Telephone Encounter (Signed)
Called patient to inform of New HDR VC, lvm for a return call

## 2023-02-08 ENCOUNTER — Telehealth: Payer: Self-pay

## 2023-03-05 ENCOUNTER — Telehealth: Payer: Self-pay | Admitting: *Deleted

## 2023-03-05 NOTE — Telephone Encounter (Signed)
CALLED PATIENT TO REMIND OF NEW HDR [facility] FOR 03-06-23, LVM FOR A RETURN CALL

## 2023-03-05 NOTE — Progress Notes (Signed)
  Radiation Oncology         [phone]) [phone] ________________________________  Name: Tara Adams MRN: [id number]  Date: 03/06/2023  DOB: 11/06/1950  CC: [name], MD  Carver Fila, MD  HDR BRACHYTHERAPY NOTE  DIAGNOSIS: The encounter diagnosis was Endometrial cancer [facility]).   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 She will be treated with a 2.5 cm diameter segmented cylinder. This conforms to her anatomy without undue discomfort.  Vaginal brachytherapy procedure node: The patient was brought to the HDR suite. Identity was confirmed. All relevant records and images related to the planned course of therapy were reviewed. The patient freely provided informed written consent to proceed with treatment after reviewing the details related to the planned course of therapy. The consent form was witnessed and verified by the simulation staff. Then, the patient was set-up in a stable reproducible supine position for radiation therapy. Pelvic exam revealed the vaginal cuff to be intact . The patient's custom vaginal cylinder was placed in the proximal vagina. This was affixed to the CT/MR stabilization plate to prevent slippage. Patient tolerated the placement well.  Verification simulation note:  A fiducial marker was placed within the vaginal cylinder. An AP and lateral film was then obtained through the pelvis area. This documented accurate position of the vaginal cylinder for treatment.  HDR BRACHYTHERAPY TREATMENT  The remote afterloading device was affixed to the vaginal cylinder by catheter. Patient then proceeded to undergo her first high-dose-rate treatment directed at the proximal vagina. The patient was prescribed a dose of 6.0 gray to be delivered to the mucosal surface. Treatment length was 3.0 cm. Patient was treated with 1  channel using 7 dwell positions. Treatment time was 259.4 seconds. Iridium 192 was the high-dose-rate source for treatment. The patient tolerated the treatment well. After completion of her therapy, a radiation survey was performed documenting return of the iridium source into the Gamma[employer] safe.   PLAN: The patient will return later this week for her second high-dose-rate treatment.  ________________________________  -----------------------------------  Billie Lade, PhD, MD  This document serves as a record of services personally performed by Antony Blackbird, MD. It was created on his behalf by Neena Rhymes, a trained medical scribe. The creation of this record is based on the scribe's personal observations and the provider's statements to them. This document has been checked and approved by the attending provider.

## 2023-03-05 NOTE — Progress Notes (Signed)
Radiation Oncology         (336) [phone] ________________________________  Name: Tara Adams MRN: [id number]  Date: 03/06/2023  DOB: 1951/06/05  Vaginal Brachytherapy Procedure Note  CC: [name], MD Carver Fila, MD  No diagnosis found.  Diagnosis: The encounter diagnosis was Endometrial cancer (HCC).   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 ***  No significant interval history since the patient was seen in consultation on 02/05/23.   ALLERGIES: is allergic to codeine.  Meds: Current Outpatient Medications  Medication Sig Dispense Refill   Ascorbic Acid (VITAMIN C) 1000 MG tablet Take 1,000 mg by mouth daily.     atorvastatin (LIPITOR) 80 MG tablet Take 1 tablet (80 mg total) by mouth daily. 90 tablet 0   butalbital-acetaminophen-caffeine (FIORICET) 50-325-40 MG tablet Take 1 tablet by mouth as needed for headache or migraine.     CALCIUM-VITAMIN D PO Take 1 tablet by mouth daily.     carvedilol (COREG) 25 MG tablet Take 1 tablet (25 mg total) by mouth 2 (two) times daily. 180 tablet 0   Cholecalciferol (VITAMIN D3) 125 MCG (5000 UT) CAPS Take 1 capsule by mouth daily.     ezetimibe (ZETIA) 10 MG tablet Take 1 tablet (10 mg total) by mouth daily. 90 tablet 3   gabapentin (NEURONTIN) 100 MG capsule Take 100 mg by mouth as needed (pain).     hydrocortisone cream 0.5 % Apply 1 application topically as needed for itching (Eczema).     isosorbide mononitrate (IMDUR) 60 MG 24 hr tablet Take 1.5 tablets (90 mg total) by mouth daily. (Patient taking differently: Take 30-60 mg by mouth See admin instructions. Take 60 mg by mouth in the morning and 30 mg by mouth at bedtime.) 135 tablet 3   losartan (COZAAR) 100 MG tablet Take 1 tablet (100 mg total) by mouth daily. 30 tablet 3   Multiple Vitamin (MULTIVITAMIN WITH  MINERALS) TABS tablet Take 1 tablet by mouth daily.     Multiple Vitamins-Minerals (HAIR/SKIN/NAILS/BIOTIN PO) Take 2 tablets by mouth daily.     nitroGLYCERIN (NITROSTAT) 0.4 MG SL tablet Place 0.4 mg under the tongue every 5 (five) minutes as needed for chest pain.     Omega-3 Fatty Acids (FISH OIL) 300 MG CAPS Take 300 mg by mouth once. Patient takes this over the counter once daily     OVER THE COUNTER MEDICATION Take 1 tablet by mouth daily. Beets chew     Polyethyl Glycol-Propyl Glycol (SYSTANE OP) Place 1 drop into both eyes in the morning, at noon, in the evening, and at bedtime.     potassium chloride (KLOR-CON M) 10 MEQ tablet Take 1 tablet (10 mEq total) by mouth 2 (two) times daily. 4 tablet 0   Probiotic Product (PROBIOTIC PO) Take 2 capsules by mouth daily.     senna-docusate (SENOKOT-S) 8.6-50 MG tablet Take 2 tablets by mouth at bedtime. For AFTER surgery, do not take if having diarrhea (Patient taking differently: Take 2 tablets by mouth as needed for mild constipation or moderate constipation.) 30 tablet 0   tiZANidine (ZANAFLEX) 4 MG tablet Take 4 mg by mouth as needed for muscle spasms.     triamterene-hydrochlorothiazide (MAXZIDE) 75-50 MG tablet Take 1 tablet by mouth daily.     vitamin B-12 (CYANOCOBALAMIN) 1000 MCG tablet Take 1,000 mcg by mouth daily.  No current facility-administered medications for this encounter.    Physical Findings: The patient is in no acute distress. Patient is alert and oriented.  vitals were not taken for this visit.   No palpable cervical, supraclavicular or axillary lymphoadenopathy. The heart has a regular rate and rhythm. The lungs are clear to auscultation. Abdomen soft and non-tender.  On pelvic examination the external genitalia were unremarkable. A speculum exam was performed. Vaginal cuff intact, no mucosal lesions. On bimanual exam there were no pelvic masses appreciated. ***  Lab Findings: Lab Results  Component Value Date    WBC 7.5 01/05/2023   HGB 10.0 (L) 01/08/2023   HCT 31.9 (L) 01/08/2023   MCV 78.8 (L) 01/05/2023   PLT 222 01/05/2023    Radiographic Findings: No results found.  Impression: The encounter diagnosis was Endometrial cancer (HCC).   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 The patient will be treated with a *** cm diameter cylinder with a treatment length of *** cm. This distended the vaginal vault without undue discomfort. The patient tolerated the procedure well.  The patient was successfully fitted for a vaginal cylinder. The patient is appropriate to begin vaginal brachytherapy.   Plan: The patient will proceed with CT simulation and vaginal brachytherapy today.    _______________________________   Billie Lade, PhD, MD  This document serves as a record of services personally performed by Antony Blackbird, MD. It was created on his behalf by Neena Rhymes, a trained medical scribe. The creation of this record is based on the scribe's personal observations and the provider's statements to them. This document has been checked and approved by the attending provider.

## 2023-03-06 ENCOUNTER — Ambulatory Visit
Admission: RE | Admit: 2023-03-06 | Discharge: 2023-03-06 | Disposition: A | Discharge status: Home / Self Care | Payer: Medicare HMO | Source: Ambulatory Visit | Attending: Radiation Oncology | Admitting: Radiation Oncology

## 2023-03-06 ENCOUNTER — Encounter: Payer: Self-pay | Admitting: Radiation Oncology

## 2023-03-06 ENCOUNTER — Other Ambulatory Visit: Payer: Self-pay

## 2023-03-06 ENCOUNTER — Ambulatory Visit: Payer: Medicare HMO

## 2023-03-06 VITALS — BP 135/64 | HR 64 | Temp 97.3°F | Resp 18 | Ht 63.0 in | Wt 207.4 lb

## 2023-03-06 DIAGNOSIS — C541 Malignant neoplasm of endometrium: Secondary | ICD-10-CM

## 2023-03-06 DIAGNOSIS — Z90722 Acquired absence of ovaries, bilateral: Secondary | ICD-10-CM | POA: Diagnosis not present

## 2023-03-06 DIAGNOSIS — Z90710 Acquired absence of both cervix and uterus: Secondary | ICD-10-CM | POA: Diagnosis not present

## 2023-03-06 DIAGNOSIS — Z79899 Other long term (current) drug therapy: Secondary | ICD-10-CM | POA: Insufficient documentation

## 2023-03-06 LAB — RAD ONC ARIA SESSION SUMMARY
Course Elapsed Days: 0
Plan Fractions Treated to Date: 1
Plan Prescribed Dose Per Fraction: 6 Gy
Plan Total Fractions Prescribed: 5
Plan Total Prescribed Dose: 30 Gy
Reference Point Dosage Given to Date: 6 Gy
Reference Point Session Dosage Given: 6 Gy
Session Number: 1

## 2023-03-07 ENCOUNTER — Telehealth: Payer: Self-pay | Admitting: *Deleted

## 2023-03-07 NOTE — Progress Notes (Signed)
  Radiation Oncology         (336) [phone] ________________________________  Name: Tara Adams MRN: [id number]  Date: 03/08/2023  DOB: [birth year]  CC: [name], MD  Carver Fila, MD  HDR BRACHYTHERAPY NOTE  DIAGNOSIS: The encounter diagnosis was Endometrial cancer [facility]).   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 She will be treated with a 2.5 cm diameter segmented cylinder. This conforms to her anatomy without undue discomfort.  Vaginal brachytherapy procedure node: The patient was brought to the HDR suite. Identity was confirmed. All relevant records and images related to the planned course of therapy were reviewed. The patient freely provided informed written consent to proceed with treatment after reviewing the details related to the planned course of therapy. The consent form was witnessed and verified by the simulation staff. Then, the patient was set-up in a stable reproducible supine position for radiation therapy. Pelvic exam revealed the vaginal cuff to be intact . The patient's custom vaginal cylinder was placed in the proximal vagina. This was affixed to the CT/MR stabilization plate to prevent slippage. Patient tolerated the placement well.  Verification simulation note:  A fiducial marker was placed within the vaginal cylinder. An AP and lateral film was then obtained through the pelvis area. This documented accurate position of the vaginal cylinder for treatment.  HDR BRACHYTHERAPY TREATMENT  The remote afterloading device was affixed to the vaginal cylinder by catheter. Patient then proceeded to undergo her second high-dose-rate treatment directed at the proximal vagina. The patient was prescribed a dose of 6.0 gray to be delivered to the mucosal surface. Treatment length was 3.0 cm. Patient was treated with 1  channel using 7 dwell positions. Treatment time was 264.2 seconds. Iridium 192 was the high-dose-rate source for treatment. The patient tolerated the treatment well. After completion of her therapy, a radiation survey was performed documenting return of the iridium source into the Gamma[employer] safe.   PLAN: The patient will return next week for her third high-dose-rate treatment.  ________________________________  -----------------------------------  Billie Lade, PhD, MD  This document serves as a record of services personally performed by Antony Blackbird, MD. It was created on his behalf by Neena Rhymes, a trained medical scribe. The creation of this record is based on the scribe's personal observations and the provider's statements to them. This document has been checked and approved by the attending provider.

## 2023-03-07 NOTE — Telephone Encounter (Signed)
CALLED PATIENT TO REMIND OF HDR TX. FOR 03-08-23 @ 2 PM, SPOKE WITH PATIENT AND SHE IS AWARE OF THIS TX.

## 2023-03-08 ENCOUNTER — Ambulatory Visit
Admission: RE | Admit: 2023-03-08 | Discharge: 2023-03-08 | Disposition: A | Discharge status: Home / Self Care | Payer: Medicare HMO | Source: Ambulatory Visit | Attending: Radiation Oncology | Admitting: Radiation Oncology

## 2023-03-08 ENCOUNTER — Other Ambulatory Visit: Payer: Self-pay

## 2023-03-08 DIAGNOSIS — C541 Malignant neoplasm of endometrium: Secondary | ICD-10-CM

## 2023-03-08 LAB — RAD ONC ARIA SESSION SUMMARY
Course Elapsed Days: 2
Plan Fractions Treated to Date: 2
Plan Prescribed Dose Per Fraction: 6 Gy
Plan Total Fractions Prescribed: 5
Plan Total Prescribed Dose: 30 Gy
Reference Point Dosage Given to Date: 12 Gy
Reference Point Session Dosage Given: 6 Gy
Session Number: 2

## 2023-03-12 ENCOUNTER — Telehealth: Payer: Self-pay | Admitting: *Deleted

## 2023-03-12 NOTE — Progress Notes (Signed)
  Radiation Oncology         (336) [phone] ________________________________  Name: Tara Adams MRN: [id number]  Date: 03/13/2023  DOB: 1950-09-29  CC: [name], MD  Carver Fila, MD  HDR BRACHYTHERAPY NOTE  DIAGNOSIS: The encounter diagnosis was Endometrial cancer [facility]).   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 She will be treated with a 2.5 cm diameter segmented cylinder. This conforms to her anatomy without undue discomfort.  Vaginal brachytherapy procedure node: The patient was brought to the HDR suite. Identity was confirmed. All relevant records and images related to the planned course of therapy were reviewed. The patient freely provided informed written consent to proceed with treatment after reviewing the details related to the planned course of therapy. The consent form was witnessed and verified by the simulation staff. Then, the patient was set-up in a stable reproducible supine position for radiation therapy. Pelvic exam revealed the vaginal cuff to be intact . The patient's custom vaginal cylinder was placed in the proximal vagina. This was affixed to the CT/MR stabilization plate to prevent slippage. Patient tolerated the placement well.  Verification simulation note:  A fiducial marker was placed within the vaginal cylinder. An AP and lateral film was then obtained through the pelvis area. This documented accurate position of the vaginal cylinder for treatment.  HDR BRACHYTHERAPY TREATMENT  The remote afterloading device was affixed to the vaginal cylinder by catheter. Patient then proceeded to undergo her third high-dose-rate treatment directed at the proximal vagina. The patient was prescribed a dose of 6.0 gray to be delivered to the mucosal surface. Treatment length was 3.0 cm. Patient was treated with 1  channel using 7 dwell positions. Treatment time was 277.0 seconds. Iridium 192 was the high-dose-rate source for treatment. The patient tolerated the treatment well. After completion of her therapy, a radiation survey was performed documenting return of the iridium source into the Gamma[employer] safe.   PLAN: The patient will return later this week for her fourth high-dose-rate treatment.  ________________________________  -----------------------------------  Billie Lade, PhD, MD  This document serves as a record of services personally performed by Antony Blackbird, MD. It was created on his behalf by Neena Rhymes, a trained medical scribe. The creation of this record is based on the scribe's personal observations and the provider's statements to them. This document has been checked and approved by the attending provider.

## 2023-03-12 NOTE — Telephone Encounter (Signed)
CALLED PATIENT TO REMIND OF HDR TX. FOR 03-13-23 @ 10 AM, LVM FOR A RETURN CALL

## 2023-03-13 ENCOUNTER — Other Ambulatory Visit: Payer: Self-pay

## 2023-03-13 ENCOUNTER — Ambulatory Visit
Admission: RE | Admit: 2023-03-13 | Discharge: 2023-03-13 | Disposition: A | Discharge status: Home / Self Care | Payer: Medicare HMO | Source: Ambulatory Visit | Attending: Radiation Oncology | Admitting: Radiation Oncology

## 2023-03-13 DIAGNOSIS — C541 Malignant neoplasm of endometrium: Secondary | ICD-10-CM | POA: Diagnosis not present

## 2023-03-13 LAB — RAD ONC ARIA SESSION SUMMARY
Course Elapsed Days: 7
Plan Fractions Treated to Date: 3
Plan Prescribed Dose Per Fraction: 6 Gy
Plan Total Fractions Prescribed: 5
Plan Total Prescribed Dose: 30 Gy
Reference Point Dosage Given to Date: 18 Gy
Reference Point Session Dosage Given: 6 Gy
Session Number: 3

## 2023-03-14 ENCOUNTER — Telehealth: Payer: Self-pay | Admitting: *Deleted

## 2023-03-14 NOTE — Progress Notes (Signed)
  Radiation Oncology         (336) [phone] ________________________________  Name: Tara Adams MRN: [id number]  Date: 03/15/2023  DOB: 06-Aug-1951  CC: [name], MD  Carver Fila, MD  HDR BRACHYTHERAPY NOTE  DIAGNOSIS: The encounter diagnosis was Endometrial cancer [facility]).   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 She will be treated with a 2.5 cm diameter segmented cylinder. This conforms to her anatomy without undue discomfort.  Vaginal brachytherapy procedure node: The patient was brought to the HDR suite. Identity was confirmed. All relevant records and images related to the planned course of therapy were reviewed. The patient freely provided informed written consent to proceed with treatment after reviewing the details related to the planned course of therapy. The consent form was witnessed and verified by the simulation staff. Then, the patient was set-up in a stable reproducible supine position for radiation therapy. Pelvic exam revealed the vaginal cuff to be intact . The patient's custom vaginal cylinder was placed in the proximal vagina. This was affixed to the CT/MR stabilization plate to prevent slippage. Patient tolerated the placement well.  Verification simulation note:  A fiducial marker was placed within the vaginal cylinder. An AP and lateral film was then obtained through the pelvis area. This documented accurate position of the vaginal cylinder for treatment.  HDR BRACHYTHERAPY TREATMENT  The remote afterloading device was affixed to the vaginal cylinder by catheter. Patient then proceeded to undergo her fourth high-dose-rate treatment directed at the proximal vagina. The patient was prescribed a dose of 6.0 gray to be delivered to the mucosal surface. Treatment length was 3.0 cm. Patient was treated with 1  channel using 7 dwell positions. Treatment time was 282.4 seconds. Iridium 192 was the high-dose-rate source for treatment. The patient tolerated the treatment well. After completion of her therapy, a radiation survey was performed documenting return of the iridium source into the GammaMed safe.   PLAN: The patient will return next week for her fifth high-dose-rate treatment.  ________________________________  -----------------------------------  Billie Lade, PhD, MD  This document serves as a record of services personally performed by Antony Blackbird, MD. It was created on his behalf by Neena Rhymes, a trained medical scribe. The creation of this record is based on the scribe's personal observations and the provider's statements to them. This document has been checked and approved by the attending provider.

## 2023-03-14 NOTE — Telephone Encounter (Signed)
CALLED PATIENT TO REMIND OF HDR TX. FOR 03-15-23 @ 10 AM, LVM FOR A RETURN CALL

## 2023-03-15 ENCOUNTER — Ambulatory Visit
Admission: RE | Admit: 2023-03-15 | Discharge: 2023-03-15 | Disposition: A | Discharge status: Home / Self Care | Payer: Medicare HMO | Source: Ambulatory Visit | Attending: Radiation Oncology | Admitting: Radiation Oncology

## 2023-03-15 ENCOUNTER — Other Ambulatory Visit: Payer: Self-pay

## 2023-03-15 DIAGNOSIS — C541 Malignant neoplasm of endometrium: Secondary | ICD-10-CM

## 2023-03-15 LAB — RAD ONC ARIA SESSION SUMMARY
Course Elapsed Days: 9
Plan Fractions Treated to Date: 4
Plan Prescribed Dose Per Fraction: 6 Gy
Plan Total Fractions Prescribed: 5
Plan Total Prescribed Dose: 30 Gy
Reference Point Dosage Given to Date: 24 Gy
Reference Point Session Dosage Given: 6 Gy
Session Number: 4

## 2023-03-16 ENCOUNTER — Telehealth: Payer: Self-pay | Admitting: *Deleted

## 2023-03-16 NOTE — Telephone Encounter (Signed)
CALLED PATIENT TO REMIND OF HDR TX. FOR 03-19-23 @ 10 AM, LVM FOR A RETURN CALL

## 2023-03-18 NOTE — Progress Notes (Signed)
  Radiation Oncology         [phone]) [phone] ________________________________  Name: Tara Adams MRN: [id number]  Date: 03/19/2023  DOB: 18-Jul-1951  CC: [name], MD  Carver Fila, MD  HDR BRACHYTHERAPY NOTE  DIAGNOSIS: The encounter diagnosis was Endometrial cancer [facility]).   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 She will be treated with a 2.5 cm diameter segmented cylinder. This conforms to her anatomy without undue discomfort.  Vaginal brachytherapy procedure node: The patient was brought to the HDR suite. Identity was confirmed. All relevant records and images related to the planned course of therapy were reviewed. The patient freely provided informed written consent to proceed with treatment after reviewing the details related to the planned course of therapy. The consent form was witnessed and verified by the simulation staff. Then, the patient was set-up in a stable reproducible supine position for radiation therapy. Pelvic exam revealed the vaginal cuff to be intact . The patient's custom vaginal cylinder was placed in the proximal vagina. This was affixed to the CT/MR stabilization plate to prevent slippage. Patient tolerated the placement well.  Verification simulation note:  A fiducial marker was placed within the vaginal cylinder. An AP and lateral film was then obtained through the pelvis area. This documented accurate position of the vaginal cylinder for treatment.  HDR BRACHYTHERAPY TREATMENT  The remote afterloading device was affixed to the vaginal cylinder by catheter. Patient then proceeded to undergo her fifth high-dose-rate treatment directed at the proximal vagina. The patient was prescribed a dose of 6.0 gray to be delivered to the mucosal surface. Treatment length was 3.0 cm. Patient was treated with 1  channel using 7 dwell positions. Treatment time was 293.1 seconds. Iridium 192 was the high-dose-rate source for treatment. The patient tolerated the treatment well. After completion of her therapy, a radiation survey was performed documenting return of the iridium source into the Gamma[employer] safe.   PLAN: The patient has completed her fifth and final high-dose-rate treatment. She tolerated treatment well overall without any significant side effects. She will return for routine follow-up in one month.  ________________________________  -----------------------------------  Billie Lade, PhD, MD  This document serves as a record of services personally performed by Antony Blackbird, MD. It was created on his behalf by Neena Rhymes, a trained medical scribe. The creation of this record is based on the scribe's personal observations and the provider's statements to them. This document has been checked and approved by the attending provider.

## 2023-03-19 ENCOUNTER — Ambulatory Visit
Admission: RE | Admit: 2023-03-19 | Discharge: 2023-03-19 | Disposition: A | Discharge status: Home / Self Care | Payer: Medicare HMO | Source: Ambulatory Visit | Attending: Radiation Oncology | Admitting: Radiation Oncology

## 2023-03-19 ENCOUNTER — Other Ambulatory Visit: Payer: Self-pay

## 2023-03-19 DIAGNOSIS — C541 Malignant neoplasm of endometrium: Secondary | ICD-10-CM | POA: Diagnosis not present

## 2023-03-19 LAB — RAD ONC ARIA SESSION SUMMARY
Course Elapsed Days: 13
Plan Fractions Treated to Date: 5
Plan Prescribed Dose Per Fraction: 6 Gy
Plan Total Fractions Prescribed: 5
Plan Total Prescribed Dose: 30 Gy
Reference Point Dosage Given to Date: 30 Gy
Reference Point Session Dosage Given: 6 Gy
Session Number: 5

## 2023-03-20 NOTE — Radiation Completion Notes (Signed)
Patient Name: Tara Adams, Tara Adams MRN: [id number] Date of Birth: 08/16/1951 Referring Physician: Eugene Garnet, M.D. Date of Service: 2023-03-20 Radiation Oncologist: Arnette Schaumann, M.D. [facility]                             RADIATION ONCOLOGY END OF TREATMENT NOTE     Diagnosis: C54.1 Malignant neoplasm of endometrium Intent: Curative     ==========DELIVERED PLANS==========  First Treatment Date: 2023-03-06 - Last Treatment Date: 2023-03-19   Plan Name: [id number] Site: Vagina Technique: HDR Ir-192 Mode: Brachytherapy Dose Per Fraction: 6 Gy Prescribed Dose (Delivered / Prescribed): 30 Gy / 30 Gy Prescribed Fxs (Delivered / Prescribed): 5 / 5     ==========ON TREATMENT VISIT DATES========== 2023-03-06, 2023-03-08, 2023-03-13, 2023-03-15, 2023-03-19     ==========UPCOMING VISITS==========       ==========APPENDIX - ON TREATMENT VISIT NOTES==========   See weekly On Treatment Notes in Epic for details.

## 2023-03-26 ENCOUNTER — Ambulatory Visit: Payer: Medicare HMO | Attending: Cardiology

## 2023-03-26 DIAGNOSIS — E785 Hyperlipidemia, unspecified: Secondary | ICD-10-CM

## 2023-03-26 LAB — LIPID PANEL
Chol/HDL Ratio: 2.6 ratio (ref 0.0–4.4)
Cholesterol, Total: 142 mg/dL (ref 100–199)
HDL: 54 mg/dL (ref >39)
LDL Chol Calc (NIH): 60 mg/dL (ref 0–99)
Triglycerides: 168 mg/dL — ABNORMAL HIGH (ref 0–149)
VLDL Cholesterol Cal: 28 mg/dL (ref 5–40)

## 2023-03-26 LAB — ALT: ALT: 15 IU/L (ref 0–32)

## 2023-03-28 ENCOUNTER — Telehealth: Payer: Self-pay

## 2023-03-28 NOTE — Telephone Encounter (Signed)
Spoke with patient about recent blood work and Michelle's recommendations - patient verbalized understanding. No further needs at this time

## 2023-03-28 NOTE — Telephone Encounter (Signed)
-----   Message from Levi Aland, NP sent at 03/27/2023  7:59 AM EDT ----- LDL cholesterol is now at goal of < 70 with the addition of ezetimibe. Triglycerides remain mildly elevated. Continue atorvastatin and ezetimibe. Focus on consuming mostly plant based foods and limit sugar and foods made from white flour. Also, walking for at least 10 minutes after a meal can reduce triglycerides and improve blood sugar balance.

## 2023-03-29 ENCOUNTER — Ambulatory Visit
Admission: RE | Admit: 2023-03-29 | Discharge: 2023-03-29 | Disposition: A | Discharge status: Home / Self Care | Payer: Medicare HMO | Source: Ambulatory Visit | Attending: Obstetrics and Gynecology | Admitting: Obstetrics and Gynecology

## 2023-03-29 DIAGNOSIS — Z13820 Encounter for screening for osteoporosis: Secondary | ICD-10-CM

## 2023-04-19 ENCOUNTER — Telehealth: Payer: Self-pay | Admitting: *Deleted

## 2023-04-19 ENCOUNTER — Ambulatory Visit
Admission: RE | Admit: 2023-04-19 | Discharge: 2023-04-19 | Disposition: A | Discharge status: Home / Self Care | Payer: Medicare HMO | Source: Ambulatory Visit | Attending: Radiation Oncology | Admitting: Radiation Oncology

## 2023-04-19 NOTE — Telephone Encounter (Signed)
Returned patient's phone call, lvm that appt. for today has been moved to 3:45 pm

## 2023-04-24 NOTE — Progress Notes (Signed)
This encounter was created in error - please disregard.

## 2023-04-26 ENCOUNTER — Ambulatory Visit
Admission: RE | Admit: 2023-04-26 | Discharge: 2023-04-26 | Disposition: A | Discharge status: Home / Self Care | Payer: Medicare HMO | Source: Ambulatory Visit | Attending: Radiation Oncology | Admitting: Radiation Oncology

## 2023-05-02 ENCOUNTER — Encounter: Payer: Self-pay | Admitting: Radiation Oncology

## 2023-05-02 NOTE — Progress Notes (Signed)
  Radiation Oncology         [phone] ________________________________  Name: Tara Adams MRN: [id number]  Date: 05/03/2023  DOB: [birth year]  End of Treatment Note  Diagnosis: The encounter diagnosis was Endometrial cancer (HCC).   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   Plan: The patient has completed radiation treatment. The patient will return to radiation oncology clinic for routine followup in one month. I advised them to call or return sooner if they have any questions or concerns related to their recovery or treatment.  -----------------------------------  Billie Lade, PhD, MD  This document serves as a record of services personally performed by Antony Blackbird, MD. It was created on his behalf by Neena Rhymes, a trained medical scribe. The creation of this record is based on the scribe's personal observations and the provider's statements to them. This document has been checked and approved by the attending provider.

## 2023-05-02 NOTE — Progress Notes (Signed)
Radiation Oncology         [phone] ________________________________  Name: Tara Adams MRN: [id number]  Date: 05/03/2023  DOB: [birth year]  Follow-Up Visit Note  CC: [name], MD  Carver Fila, MD  No diagnosis found.  Diagnosis: The encounter diagnosis was Endometrial cancer (HCC).   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  She tolerated brachytherapy well overall without any significant side effects. No significant interval history since the patient completed radiation therapy.   She did present for a bone density scan on 03/29/23 which showed normal bone density findings.   ***                              Allergies:  is allergic to codeine.  Meds: Current Outpatient Medications  Medication Sig Dispense Refill   Ascorbic Acid (VITAMIN C) 1000 MG tablet Take 1,000 mg by mouth daily.     atorvastatin (LIPITOR) 80 MG tablet Take 1 tablet (80 mg total) by mouth daily. 90 tablet 0   butalbital-acetaminophen-caffeine (FIORICET) 50-325-40 MG tablet Take 1 tablet by mouth as needed for headache or migraine.     CALCIUM-VITAMIN D PO Take 1 tablet by mouth daily.     carvedilol (COREG) 25 MG tablet Take 1 tablet (25 mg total) by mouth 2 (two) times daily. 180 tablet 0   Cholecalciferol (VITAMIN D3) 125 MCG (5000 UT) CAPS Take 1 capsule by mouth daily.     ezetimibe (ZETIA) 10 MG tablet Take 1 tablet (10 mg total) by mouth daily. 90 tablet 3   gabapentin (NEURONTIN) 100 MG capsule Take 100 mg by mouth as needed (pain).     hydrocortisone cream 0.5 % Apply 1 application topically as needed for  itching (Eczema).     isosorbide mononitrate (IMDUR) 60 MG 24 hr tablet Take 1.5 tablets (90 mg total) by mouth daily. (Patient taking differently: Take 30-60 mg by mouth See admin instructions. Take 60 mg by mouth in the morning and 30 mg by mouth at bedtime.) 135 tablet 3   losartan (COZAAR) 100 MG tablet Take 1 tablet (100 mg total) by mouth daily. 30 tablet 3   Multiple Vitamin (MULTIVITAMIN WITH MINERALS) TABS tablet Take 1 tablet by mouth daily.     Multiple Vitamins-Minerals (HAIR/SKIN/NAILS/BIOTIN PO) Take 2 tablets by mouth daily.     nitroGLYCERIN (NITROSTAT) 0.4 MG SL tablet Place 0.4 mg under the tongue every 5 (five) minutes as needed for chest pain.     Omega-3 Fatty Acids (FISH OIL) 300 MG CAPS Take 300 mg by mouth once. Patient takes this over the counter once daily     OVER THE COUNTER MEDICATION Take 1 tablet by mouth daily. Beets chew     Polyethyl Glycol-Propyl Glycol (SYSTANE OP) Place 1 drop into both eyes in the morning, at noon, in the evening, and at bedtime.     potassium chloride (KLOR-CON M) 10 MEQ tablet Take 1 tablet (10 mEq total) by mouth 2 (two) times daily. 4 tablet 0   Probiotic Product (PROBIOTIC PO) Take 2  capsules by mouth daily.     senna-docusate (SENOKOT-S) 8.6-50 MG tablet Take 2 tablets by mouth at bedtime. For AFTER surgery, do not take if having diarrhea (Patient taking differently: Take 2 tablets by mouth as needed for mild constipation or moderate constipation.) 30 tablet 0   tiZANidine (ZANAFLEX) 4 MG tablet Take 4 mg by mouth as needed for muscle spasms.     triamterene-hydrochlorothiazide (MAXZIDE) 75-50 MG tablet Take 1 tablet by mouth daily.     vitamin B-12 (CYANOCOBALAMIN) 1000 MCG tablet Take 1,000 mcg by mouth daily.     No current facility-administered medications for this encounter.    Physical Findings: The patient is in no acute distress. Patient is alert and oriented.  vitals were not taken for this visit. .  No significant changes.  Lungs are clear to auscultation bilaterally. Heart has regular rate and rhythm. No palpable cervical, supraclavicular, or axillary adenopathy. Abdomen soft, non-tender, normal bowel sounds.   Lab Findings: Lab Results  Component Value Date   WBC 7.5 01/05/2023   HGB 10.0 (L) 01/08/2023   HCT 31.9 (L) 01/08/2023   MCV 78.8 (L) 01/05/2023   PLT 222 01/05/2023    Radiographic Findings: No results found.  Impression: The encounter diagnosis was Endometrial cancer (HCC).   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  ***  Plan:  ***   *** minutes of total time was spent for this patient encounter, including preparation, face-to-face counseling with the patient and coordination of care, physical exam, and documentation of the encounter. ____________________________________  Billie Lade, PhD, MD  This document serves as a record of services personally performed by Antony Blackbird, MD. It was created on his behalf by Neena Rhymes, a trained medical scribe. The creation of this record is based on the scribe's personal observations and the provider's statements to them. This document has been checked and approved by the attending provider.

## 2023-05-03 ENCOUNTER — Ambulatory Visit
Admission: RE | Admit: 2023-05-03 | Discharge: 2023-05-03 | Disposition: A | Discharge status: Home / Self Care | Payer: Medicare HMO | Source: Ambulatory Visit | Attending: Radiation Oncology | Admitting: Radiation Oncology

## 2023-05-03 ENCOUNTER — Telehealth: Payer: Self-pay | Admitting: *Deleted

## 2023-05-03 ENCOUNTER — Encounter: Payer: Self-pay | Admitting: Radiation Oncology

## 2023-05-03 ENCOUNTER — Other Ambulatory Visit: Payer: Self-pay

## 2023-05-03 VITALS — BP 115/71 | HR 74 | Temp 97.7°F | Resp 20 | Ht 62.0 in | Wt 207.2 lb

## 2023-05-03 DIAGNOSIS — C541 Malignant neoplasm of endometrium: Secondary | ICD-10-CM | POA: Diagnosis present

## 2023-05-03 DIAGNOSIS — Z923 Personal history of irradiation: Secondary | ICD-10-CM | POA: Insufficient documentation

## 2023-05-03 HISTORY — DX: Personal history of irradiation: Z92.3

## 2023-05-03 NOTE — Progress Notes (Signed)
[name] is here today for follow up post radiation to the pelvic.  They completed their radiation on: 03/19/23   Does the patient complain of any of the following:  Pain: No Abdominal bloating: No Diarrhea/Constipation: No Nausea/Vomiting: No Vaginal Discharge: No Blood in Urine or Stool: No Urinary Issues (dysuria/incomplete emptying/ incontinence/ increased frequency/urgency): No Does patient report using vaginal dilator 2-3 times a week and/or sexually active 2-3 weeks: Patient given xs+ and S vaginal dilators with instructions and importance of use. Patient voiced understanding.  Post radiation skin changes:  No   Additional comments if applicable: Continues to have fatigue.    BP 115/71 (BP Location: Left Arm, Patient Position: Sitting, Cuff Size: Large)   Pulse 74   Temp 97.7 F (36.5 C)   Resp 20   Ht 5\' 2"  (1.575 m)   Wt 207 lb 3.2 oz (94 kg)   SpO2 100%   BMI 37.90 kg/m

## 2023-05-03 NOTE — Telephone Encounter (Signed)
Returned patient's phone call, lvm for a return call 

## 2023-05-04 ENCOUNTER — Telehealth: Payer: Self-pay

## 2023-05-04 ENCOUNTER — Telehealth: Payer: Self-pay | Admitting: *Deleted

## 2023-05-04 IMAGING — DX DG WRIST COMPLETE 3+V*R*
4 series · 4 of 4 positions shown · non-contrast
Comparison: None.

CLINICAL DATA: injury

EXAM:
RIGHT WRIST - COMPLETE 3+ VIEW

[wrist pa]
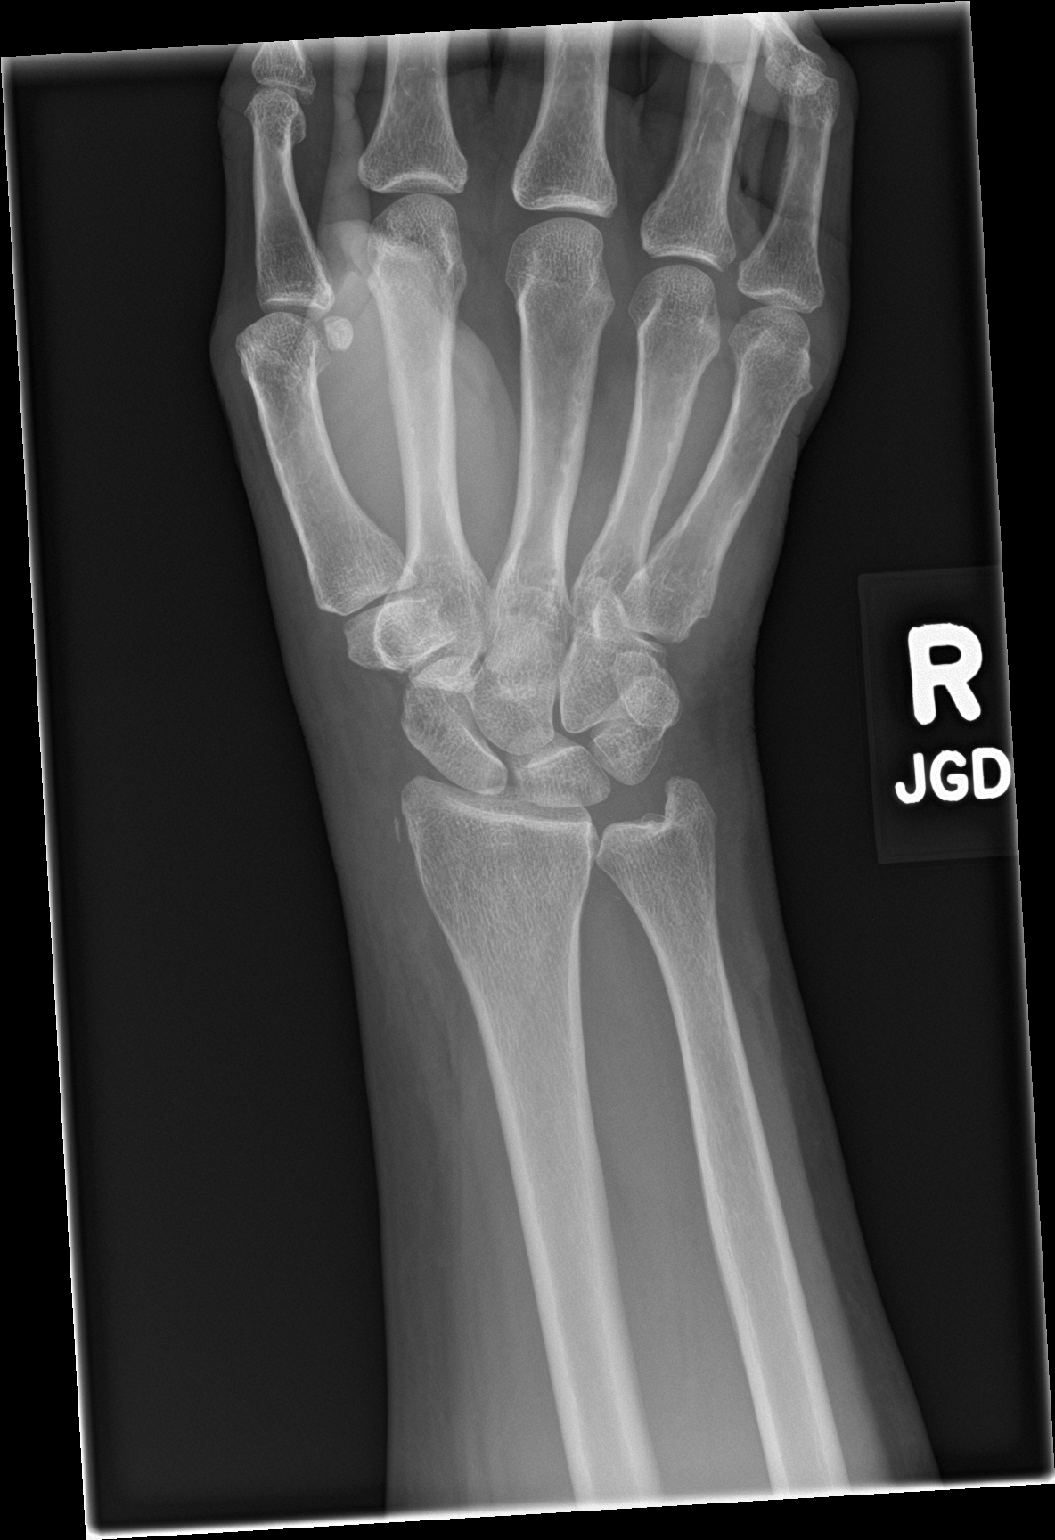

[wrist navicular]
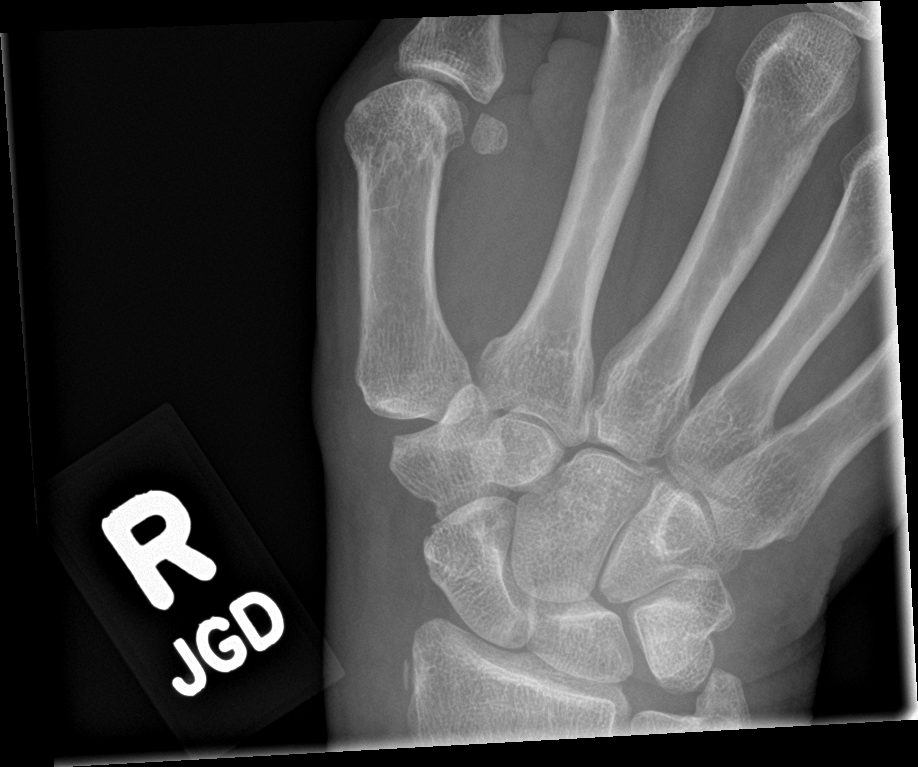

[wrist obl]
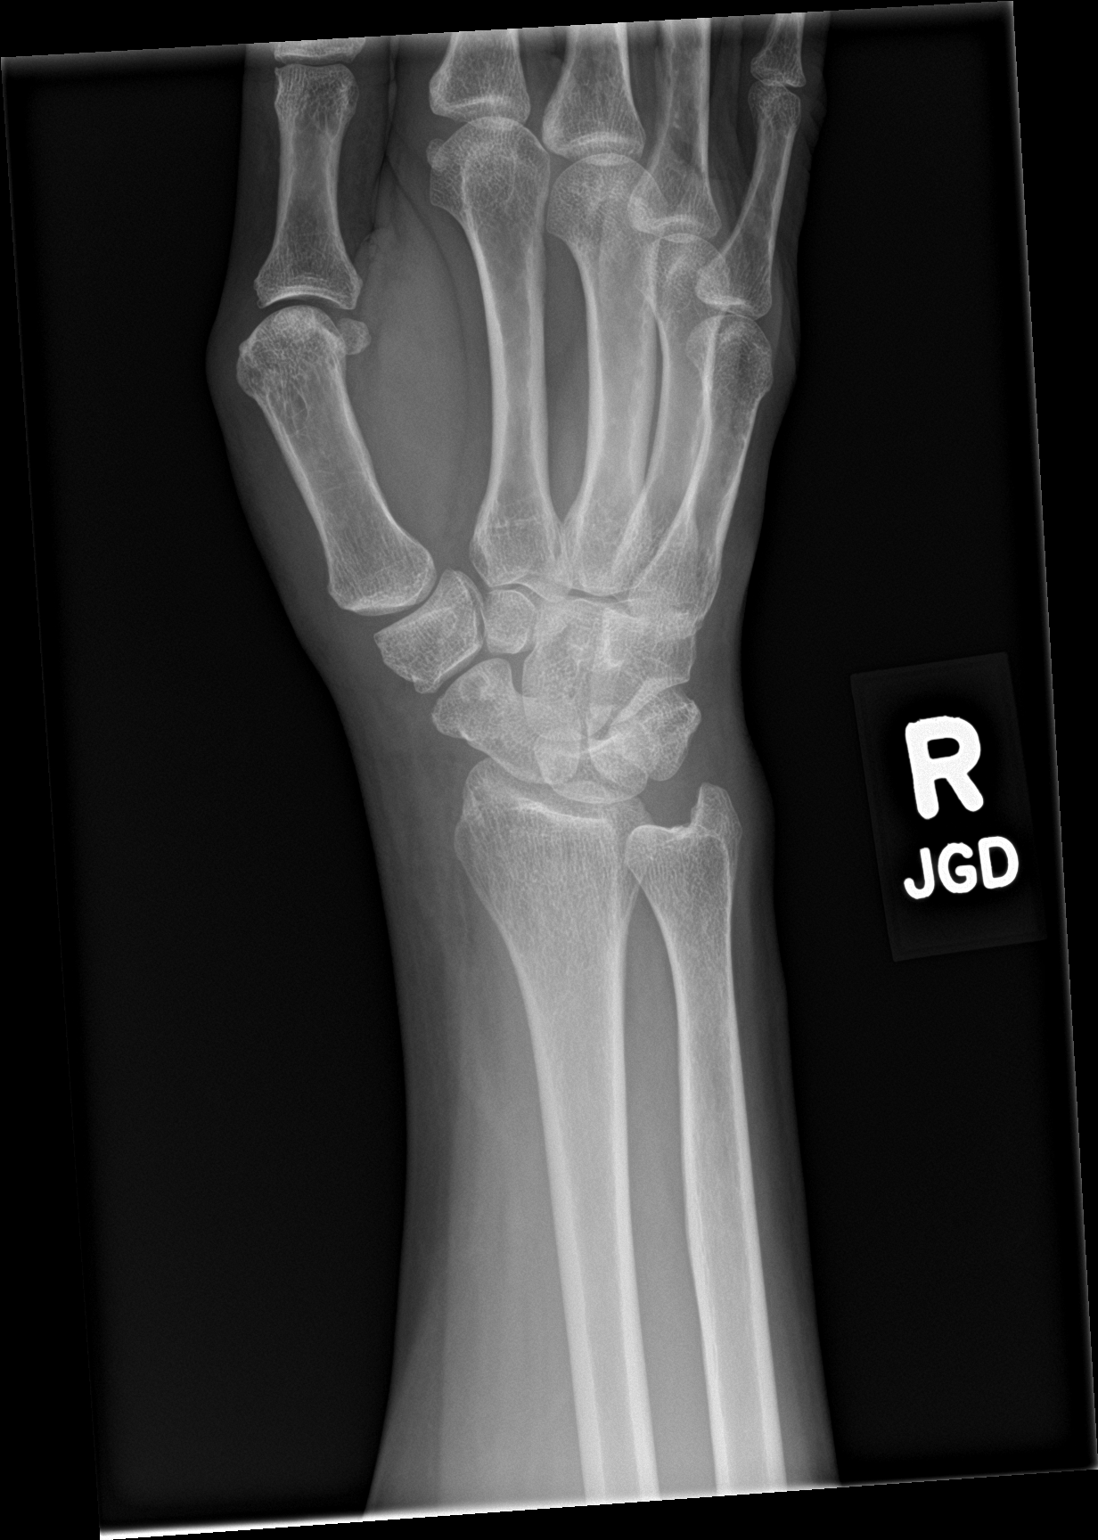

[wrist lat]
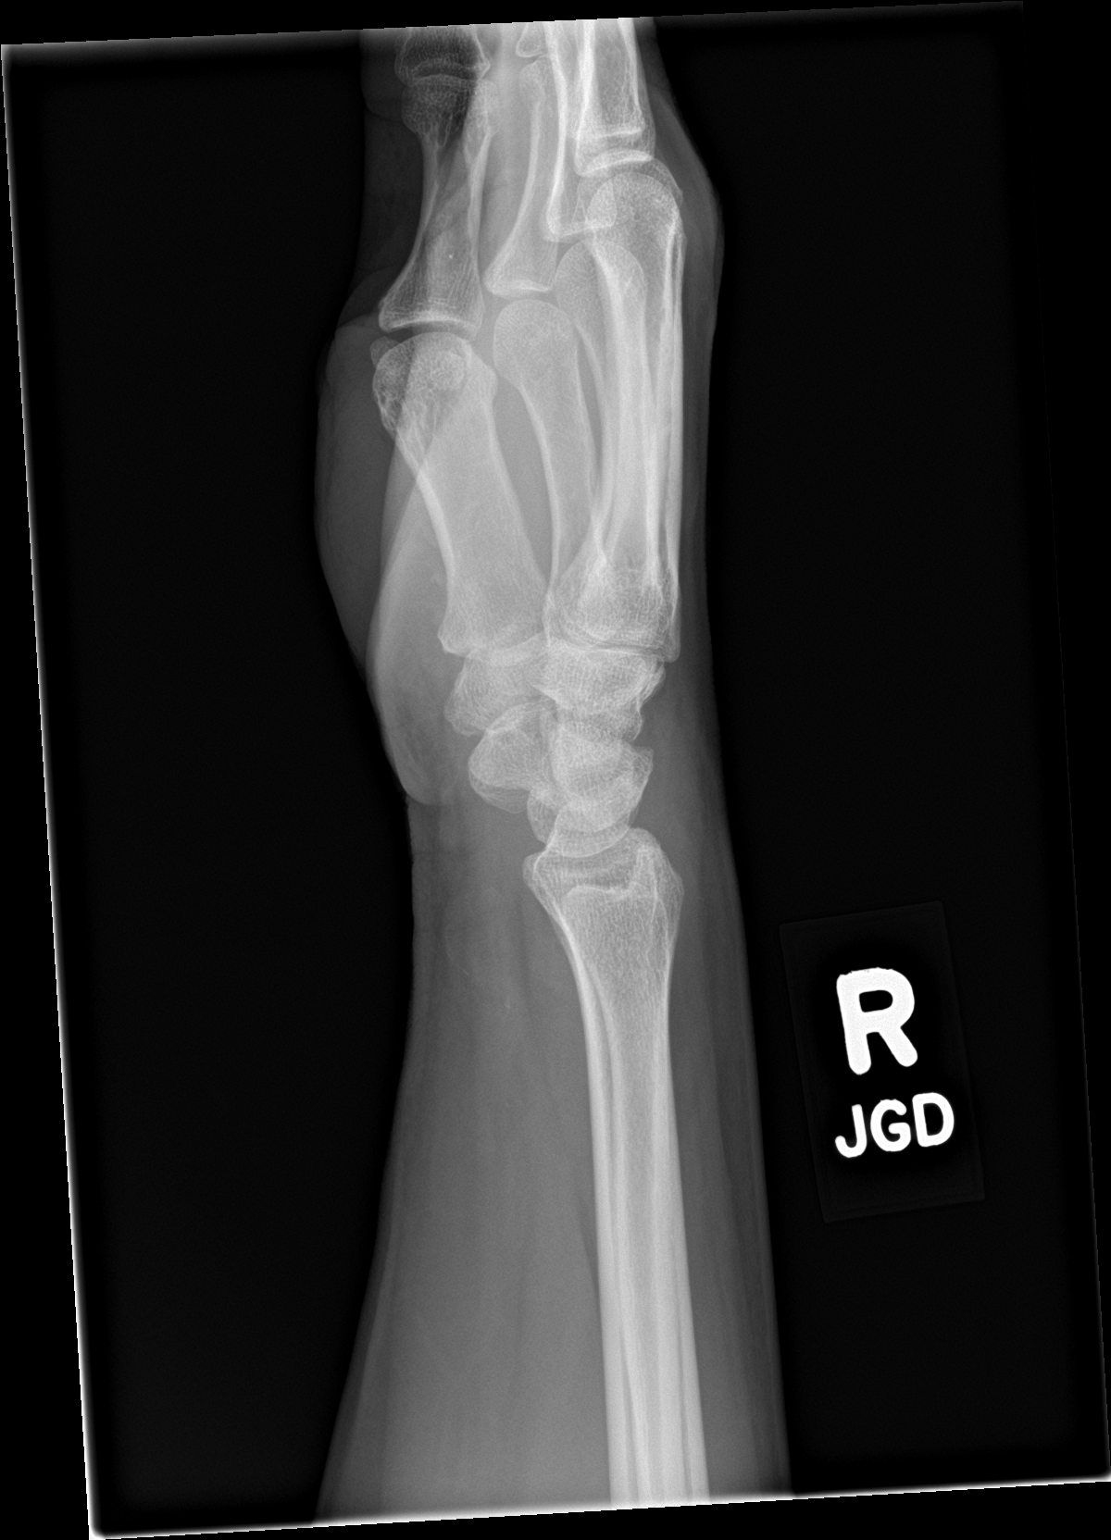

[4 of 4 positions shown; findings below may reference images not displayed]

FINDINGS: Small displaced linear fracture fragment arising from the lateral
aspect of the distal radius. Adjacent soft tissue swelling with
indistinct flat planes. Irregular ridging along the mid to distal
scaphoid, best seen on the oblique. No evidence of joint
malalignment.
IMPRESSION: 1. Small displaced linear fracture fragment arising from the lateral
aspect of the distal radius. Adjacent soft tissue swelling with
indistinct fat planes.
2. Irregular ridging along the mid to distal scaphoid. Cannot rule
out nondisplaced scaphoid fracture. Consider cross-sectional imaging
for further evaluation.

## 2023-05-04 NOTE — Telephone Encounter (Signed)
RETURNED PATIENT'S PHONE CALL, AND APPT. WITH DR. Pricilla Holm HAS BEEN MOVED TO 08-02-23 - ARRIVAL TIME- 2:30 PM, LVM FOR A RETURN CALL

## 2023-05-04 NOTE — Telephone Encounter (Signed)
Spoke with Talbert Forest who stated patient need to have appt from 10/24 rescheduled.  Rescheduled for 11/14 @3 :00pm.

## 2023-05-04 NOTE — Telephone Encounter (Signed)
CALLED PATIENT TO INFORM OF FU APPT. WITH DR. Pricilla Holm ON 07-12-23- ARRIVAL TIME- 2 PM AND HER FU WITH DR. KINARD ON 10-04-23 @ 4 PM, LVM FOR A RETURN CALL

## 2023-07-02 ENCOUNTER — Ambulatory Visit: Payer: Medicare HMO | Attending: Cardiology | Admitting: Cardiology

## 2023-07-12 ENCOUNTER — Ambulatory Visit: Payer: Medicare HMO | Admitting: Gynecologic Oncology

## 2023-08-02 ENCOUNTER — Inpatient Hospital Stay: Payer: Medicare HMO | Attending: Gynecologic Oncology | Admitting: Gynecologic Oncology

## 2023-08-02 ENCOUNTER — Encounter: Payer: Self-pay | Admitting: Gynecologic Oncology

## 2023-08-02 VITALS — BP 148/78 | HR 62 | Temp 98.1°F | Resp 20 | Wt 203.5 lb

## 2023-08-02 DIAGNOSIS — Z923 Personal history of irradiation: Secondary | ICD-10-CM | POA: Diagnosis not present

## 2023-08-02 DIAGNOSIS — Z9079 Acquired absence of other genital organ(s): Secondary | ICD-10-CM | POA: Diagnosis not present

## 2023-08-02 DIAGNOSIS — Z90722 Acquired absence of ovaries, bilateral: Secondary | ICD-10-CM | POA: Diagnosis not present

## 2023-08-02 DIAGNOSIS — Z90710 Acquired absence of both cervix and uterus: Secondary | ICD-10-CM | POA: Diagnosis not present

## 2023-08-02 DIAGNOSIS — C541 Malignant neoplasm of endometrium: Secondary | ICD-10-CM | POA: Insufficient documentation

## 2023-08-02 NOTE — Patient Instructions (Signed)
It was good to see you today.  I do not see or feel any evidence of cancer recurrence on your exam.  I will see you for follow-up in 6 months.  Please try to remember to use your vaginal dilator regularly.  As always, if you develop any new and concerning symptoms before your next visit, please call to see me sooner.

## 2023-08-02 NOTE — Progress Notes (Signed)
Please remember to gynecologic Oncology Return Clinic Visit  08/02/23  Reason for Visit: surveillance  Treatment History: Oncology History Overview Note  The patient presented on 10/23/22 with PMB. First episode occurred in December with second episode in January. Both episodes with light bleeding, first x3 days and second for 1 day.    Endometrial cancer (HCC)  11/13/2022 Initial Biopsy   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 MMR IHC intact.    12/15/2022 Initial Diagnosis   Endometrial cancer (HCC)   01/04/2023 Surgery   Robotic-assisted laparoscopic total hysterectomy with bilateral salpingoophorectomy, SLN biopsy, lysis of adhesions for approximately 30 minutes, mini-lap for specimen delivery (uterus > 250gms) , cystoscopy    01/04/2023 Pathology Results   A. SENTINEL LYMPH NODE, RIGHT OBTURATOR, EXCISION:  -  1 lymph node, negative for malignancy (0/1), serially sectioned and  multiple levels examined.   B. SENTINEL LYMPH NODE, LEFT OBTURATOR, EXCISION:  -  1 lymph node, negative for malignancy (0/1), serially sectioned and  multiple levels examined.   C. UTERUS, CERVIX, BILATERAL FALLOPIAN TUBES AND OVARIES:  -  Endometrioid carcinoma with mucinous differentiation, FIGO grade 1 of  3.  - Extends 12 mm into the myometrium (myometrial thickness 20 mm) for  overall percentage of myometrial invasion of 60%, FIGO stage  IB.  -  Incidental benign leiomyomas of the myometrium  -  Unremarkable bilateral fallopian tubes and ovaries (uninvolved by  carcinoma).  - See synoptic report below for further information.    03/06/2023 - 03/19/2023 Radiation Therapy   Radiation treatment dates: 03/06/23 through 03/19/23    Site/dose:  Vagina - 30 Gy delivered in 5 Fx at 6 Gy/Fx   Technique/Mode: HDR Ir-192 / Brachytherapy      Interval History: Patient reports doing well.  She denies any vaginal bleeding or discharge.  She has not been using  her vaginal dilator.  Denies any change to bowel or bladder function although has some pain which she thinks may be related to decreased water intake.  Past Medical/Surgical History: Past Medical History:  Diagnosis Date   Cancer (HCC)    andometrial   Carotid artery occlusion    Coronary artery disease    a. ~ 2002 s/p CABG x 2 ([facility]); b. 03/2021 MV: Inferior, inferolateral, mid and apical anterior ischemia. EF 41%. High risk study.   Headache    migraines   History of radiation therapy    Endometrium- 03/06/23-03/19/23- Dr. Antony Blackbird   Hyperlipidemia    Hypertension    Morbid obesity [facility])    Sciatica     Past Surgical History:  Procedure Laterality Date   BREAST BIOPSY Left 11/09/2022   pathology - no cancer   BREAST BIOPSY Left 11/09/2022   [place] LT BREAST BX W LOC DEV 1ST LESION IMG BX SPEC US GUIDE 11/09/2022 [facility] MAMMOGRAPHY   CATARACT EXTRACTION Bilateral 10/2022   CESAREAN SECTION     CORONARY ARTERY BYPASS GRAFT N/A    LEFT HEART CATH AND CORS/GRAFTS ANGIOGRAPHY N/A 05/20/2021   Procedure: LEFT HEART CATH AND CORS/GRAFTS ANGIOGRAPHY;  Surgeon: Iran Ouch, MD;  Location: [facility] INVASIVE CV LAB;  Service: Cardiovascular;  Laterality: N/A;   ROBOTIC ASSISTED TOTAL HYSTERECTOMY WITH BILATERAL SALPINGO OOPHERECTOMY Bilateral 01/04/2023   Procedure: XI ROBOTIC ASSISTED TOTAL HYSTERECTOMY WITH BILATERAL SALPINGO OOPHORECTOMY; CYSTOSCOPY;  Surgeon: Carver Fila, MD;  Location: [facility] ORS;  Service: Gynecology;  Laterality: Bilateral;   SENTINEL NODE BIOPSY N/A 01/04/2023  Procedure: SENTINEL NODE BIOPSY;  Surgeon: Carver Fila, MD;  Location: [facility] ORS;  Service: Gynecology;  Laterality: N/A;    Family History  Problem Relation Age of Onset   Breast cancer Maternal Aunt    Colon cancer Neg Hx    Ovarian cancer Neg Hx    Endometrial cancer Neg Hx    Pancreatic cancer Neg Hx    Prostate cancer Neg Hx     Social History   Socioeconomic History    Marital status: Single    Spouse name: Not on file   Number of children: Not on file   Years of education: Not on file   Highest education level: Not on file  Occupational History   Occupation: works at [employer] at [employer]  Tobacco Use   Smoking status: Former    Current packs/day: 0.00    Types: Cigarettes    Quit date: 05/29/1981    Years since quitting: 42.2   Smokeless tobacco: Never  Vaping Use   Vaping status: Never Used  Substance and Sexual Activity   Alcohol use: Never   Drug use: Never   Sexual activity: Yes  Other Topics Concern   Not on file  Social History Narrative   Lives in [place].  Volunteers, driving elderly individuals.  Does not routinely exercise.   Social Determinants of Health   Financial Resource Strain: Not on file  Food Insecurity: Patient Declined (01/04/2023)   Hunger Vital Sign    Worried About Running Out of Food in the Last Year: Patient declined    Ran Out of Food in the Last Year: Patient declined  Transportation Needs: No Transportation Needs (01/04/2023)   PRAPARE - Administrator, Civil Service (Medical): No    Lack of Transportation (Non-Medical): No  Physical Activity: Not on file  Stress: Not on file  Social Connections: Not on file    Current Medications:  Current Outpatient Medications:    allopurinol (ZYLOPRIM) 100 MG tablet, Take 100 mg by mouth daily. Prevention for Gout, Disp: , Rfl:    Ascorbic Acid (VITAMIN C) 1000 MG tablet, Take 1,000 mg by mouth daily., Disp: , Rfl:    atorvastatin (LIPITOR) 80 MG tablet, Take 1 tablet (80 mg total) by mouth daily., Disp: 90 tablet, Rfl: 0   CALCIUM-VITAMIN D PO, Take 1 tablet by mouth daily., Disp: , Rfl:    Cholecalciferol (VITAMIN D3) 125 MCG (5000 UT) CAPS, Take 1 capsule by mouth daily., Disp: , Rfl:    ezetimibe (ZETIA) 10 MG tablet, Take 1 tablet (10 mg total) by mouth daily., Disp: 90 tablet, Rfl: 3   ferrous sulfate 325 (65 FE) MG EC tablet, Take 325 mg by mouth  daily with breakfast., Disp: , Rfl:    gabapentin (NEURONTIN) 100 MG capsule, Take 100 mg by mouth as needed (pain)., Disp: , Rfl:    hydrocortisone cream 0.5 %, Apply 1 application topically as needed for itching (Eczema)., Disp: , Rfl:    isosorbide mononitrate (IMDUR) 60 MG 24 hr tablet, Take 1.5 tablets (90 mg total) by mouth daily. (Patient taking differently: Take 30-60 mg by mouth See admin instructions. Take 60 mg by mouth in the morning and 30 mg by mouth at bedtime.), Disp: 135 tablet, Rfl: 3   Multiple Vitamin (MULTIVITAMIN WITH MINERALS) TABS tablet, Take 1 tablet by mouth daily., Disp: , Rfl:    Multiple Vitamins-Minerals (HAIR/SKIN/NAILS/BIOTIN PO), Take 2 tablets by mouth daily., Disp: , Rfl:    nitroGLYCERIN (NITROSTAT)  0.4 MG SL tablet, Place 0.4 mg under the tongue every 5 (five) minutes as needed for chest pain., Disp: , Rfl:    Omega-3 Fatty Acids (FISH OIL) 300 MG CAPS, Take 300 mg by mouth once. Patient takes this over the counter once daily, Disp: , Rfl:    OVER THE COUNTER MEDICATION, Take 1 tablet by mouth daily. Beets chew, Disp: , Rfl:    Polyethyl Glycol-Propyl Glycol (SYSTANE OP), Place 1 drop into both eyes in the morning, at noon, in the evening, and at bedtime., Disp: , Rfl:    potassium chloride (KLOR-CON M) 10 MEQ tablet, Take 1 tablet (10 mEq total) by mouth 2 (two) times daily., Disp: 4 tablet, Rfl: 0   Probiotic Product (PROBIOTIC PO), Take 2 capsules by mouth daily., Disp: , Rfl:    senna-docusate (SENOKOT-S) 8.6-50 MG tablet, Take 2 tablets by mouth at bedtime. For AFTER surgery, do not take if having diarrhea (Patient taking differently: Take 2 tablets by mouth as needed for mild constipation or moderate constipation.), Disp: 30 tablet, Rfl: 0   tiZANidine (ZANAFLEX) 4 MG tablet, Take 4 mg by mouth as needed for muscle spasms., Disp: , Rfl:    triamterene-hydrochlorothiazide (MAXZIDE) 75-50 MG tablet, Take 1 tablet by mouth daily., Disp: , Rfl:    vitamin B-12  (CYANOCOBALAMIN) 1000 MCG tablet, Take 1,000 mcg by mouth daily., Disp: , Rfl:    butalbital-acetaminophen-caffeine (FIORICET) 50-325-40 MG tablet, Take 1 tablet by mouth as needed for headache or migraine. (Patient not taking: Reported on 08/01/2023), Disp: , Rfl:    losartan (COZAAR) 100 MG tablet, Take 1 tablet (100 mg total) by mouth daily., Disp: 30 tablet, Rfl: 3  Review of Systems: Denies appetite changes, fevers, chills, fatigue, unexplained weight changes. Denies hearing loss, neck lumps or masses, mouth sores, ringing in ears or voice changes. Denies cough or wheezing.  Denies shortness of breath. Denies chest pain or palpitations. Denies leg swelling. Denies abdominal distention, pain, blood in stools, constipation, diarrhea, nausea, vomiting, or early satiety. Denies pain with intercourse, frequency, hematuria or incontinence. Denies hot flashes, pelvic pain, vaginal bleeding or vaginal discharge.   Denies joint pain, back pain or muscle pain/cramps. Denies itching, rash, or wounds. Denies dizziness, headaches, numbness or seizures. Denies swollen lymph nodes or glands, denies easy bruising or bleeding. Denies anxiety, depression, confusion, or decreased concentration.  Physical Exam: BP (!) 148/78 Comment: manual recheck, pt to monitor and follow up with PCP  Pulse 62   Temp 98.1 F (36.7 C) (Oral)   Resp 20   Wt 203 lb 8 oz (92.3 kg)   SpO2 90%   BMI 37.22 kg/m  General: Alert, oriented, no acute distress. HEENT: Normocephalic, atraumatic, sclera anicteric. Chest: Clear to auscultation bilaterally.  No wheezes or rhonchi. Cardiovascular: Regular rate and rhythm, no murmurs. Abdomen: soft, nontender.  Normoactive bowel sounds.  No masses or hepatosplenomegaly appreciated.  Extremities: Grossly normal range of motion.  Warm, well perfused.  No edema bilaterally. Skin: No rashes or lesions noted. Lymphatics: No cervical, supraclavicular, or inguinal adenopathy. GU:  Normal appearing external genitalia without erythema, excoriation, or lesions.  Speculum exam reveals cuff intact, no masses.  Mild radiation changes noted.  Bimanual exam reveals intact, no nodularity or masses.    Laboratory & Radiologic Studies: None new  Assessment & Plan: Tara Adams is a 72 y.o. 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 MMRp, p53 WT.  The patient is doing well.  She is NED on exam.  Discussed importance of regular dilator use to prevent vaginal agglutination.  Given her urinary symptoms, offered to send urinalysis and culture today.  She would like to try increasing fluid intake and will call clinic if symptoms do not resolve.   Per NCCN surveillance recommendations, we will continue with visits every 3 months alternating between my office and radiation oncology.  I discussed signs and symptoms that would be concerning for cancer recurrence and should prompt a phone call before her next scheduled visit.  She sees Dr. Roselind Messier in January.  She will return to see me in April.  20 minutes of total time was spent for this patient encounter, including preparation, face-to-face counseling with the patient and coordination of care, and documentation of the encounter.  Eugene Garnet, MD  Division of Gynecologic Oncology  Department of Obstetrics and Gynecology  [facility]

## 2023-10-04 ENCOUNTER — Ambulatory Visit: Payer: Self-pay

## 2023-10-04 ENCOUNTER — Telehealth: Payer: Self-pay | Admitting: *Deleted

## 2023-10-04 ENCOUNTER — Ambulatory Visit
Admission: RE | Admit: 2023-10-04 | Discharge: 2023-10-04 | Disposition: A | Discharge status: Home / Self Care | Payer: Medicare HMO | Source: Ambulatory Visit | Attending: Radiation Oncology | Admitting: Radiation Oncology

## 2023-10-04 NOTE — Telephone Encounter (Signed)
CALLED PATIENT TO INFORM OF FU FOR 10-04-23 DUE TO DR. KINARD'S REQUEST, APPT. HAS BEEN RESCHEDULED FOR 11-08-23 @ 8:30.AM, LVM FOR A RETURN CALL

## 2023-11-08 ENCOUNTER — Ambulatory Visit: Payer: Self-pay | Admitting: Radiation Oncology

## 2023-11-26 ENCOUNTER — Ambulatory Visit
Admission: RE | Admit: 2023-11-26 | Discharge: 2023-11-26 | Disposition: A | Discharge status: Home / Self Care | Payer: Medicare HMO | Source: Ambulatory Visit | Attending: Radiation Oncology | Admitting: Radiation Oncology

## 2023-11-26 ENCOUNTER — Telehealth: Payer: Self-pay | Admitting: Radiation Oncology

## 2023-11-26 NOTE — Telephone Encounter (Signed)
 Called patient to r/s FU appt w. Dr. Roselind Messier. No answer, LVM for a return call.

## 2023-12-02 NOTE — Progress Notes (Signed)
 Radiation Oncology         [phone] ________________________________  Name: Tara Adams MRN: [id number]  Date: 12/03/2023  DOB: [birth year]  Follow-Up Visit Note  CC: [name], MD  Carver Fila, MD  No diagnosis found. ***  Diagnosis: The encounter diagnosis was Endometrial cancer (HCC).   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  Interval Since Last Radiation: 8 months and 16 days   Indication for treatment: Curative       Radiation treatment dates: 03/06/23 through 03/19/23  Site/dose:  Vagina - 30 Gy delivered in 5 Fx at 6 Gy/Fx Technique/Mode: HDR Ir-192 / Brachytherapy   Narrative:  The patient returns today for routine follow-up. She was last seen here for follow-up on 05/03/23.             She most recently followed up with Dr. Pricilla Holm on 08/02/23 and was noted to be doing well and NED on examination at that time. She did endorse some mild dysuria which she attributed to decreased fluid intake.   No other significant interval history since the patient was last seen for follow-up.     Patient is doing well overall today. She denies any abdominal pain, abnormal bloating, vaginal bleeding, or changes in her bowel or bladder habits. She is using her dilators regularly. ***                 Allergies:  is allergic to codeine.  Meds: Current Outpatient Medications  Medication Sig Dispense Refill   allopurinol (ZYLOPRIM) 100 MG tablet Take 100 mg by mouth daily. Prevention for Gout     Ascorbic Acid (VITAMIN C) 1000 MG tablet Take 1,000 mg by mouth daily.     atorvastatin (LIPITOR) 80 MG tablet Take 1 tablet (80 mg total) by mouth daily. 90 tablet 0   butalbital-acetaminophen-caffeine (FIORICET) 50-325-40 MG tablet Take 1 tablet by mouth as needed for headache or migraine. (Patient not taking: Reported on 08/01/2023)     CALCIUM-VITAMIN D  PO Take 1 tablet by mouth daily.     Cholecalciferol (VITAMIN D3) 125 MCG (5000 UT) CAPS Take 1 capsule by mouth daily.     ezetimibe (ZETIA) 10 MG tablet Take 1 tablet (10 mg total) by mouth daily. 90 tablet 3   ferrous sulfate 325 (65 FE) MG EC tablet Take 325 mg by mouth daily with breakfast.     gabapentin (NEURONTIN) 100 MG capsule Take 100 mg by mouth as needed (pain).     hydrocortisone cream 0.5 % Apply 1 application topically as needed for itching (Eczema).     isosorbide mononitrate (IMDUR) 60 MG 24 hr tablet Take 1.5 tablets (90 mg total) by mouth daily. (Patient taking differently: Take 30-60 mg by mouth See admin instructions. Take 60 mg by mouth in the morning and 30 mg by mouth at bedtime.) 135 tablet 3   losartan (COZAAR) 100 MG tablet Take 1 tablet (100 mg total) by mouth daily. 30 tablet 3   Multiple Vitamin (MULTIVITAMIN WITH MINERALS) TABS tablet Take 1 tablet by mouth daily.     Multiple Vitamins-Minerals (HAIR/SKIN/NAILS/BIOTIN PO) Take 2 tablets by mouth daily.     nitroGLYCERIN (NITROSTAT) 0.4 MG SL tablet Place 0.4 mg under the tongue every 5 (five) minutes as needed for chest pain.     Omega-3 Fatty Acids (FISH OIL) 300 MG CAPS Take 300 mg by mouth once. Patient takes this  over the counter once daily     OVER THE COUNTER MEDICATION Take 1 tablet by mouth daily. Beets chew     Polyethyl Glycol-Propyl Glycol (SYSTANE OP) Place 1 drop into both eyes in the morning, at noon, in the evening, and at bedtime.     potassium chloride (KLOR-CON M) 10 MEQ tablet Take 1 tablet (10 mEq total) by mouth 2 (two) times daily. 4 tablet 0   Probiotic Product (PROBIOTIC PO) Take 2 capsules by mouth daily.     senna-docusate (SENOKOT-S) 8.6-50 MG tablet Take 2 tablets by mouth at bedtime. For AFTER surgery, do not take if having diarrhea (Patient taking differently: Take 2 tablets by mouth as needed for mild constipation or moderate constipation.) 30 tablet 0   tiZANidine (ZANAFLEX) 4 MG tablet  Take 4 mg by mouth as needed for muscle spasms.     triamterene-hydrochlorothiazide (MAXZIDE) 75-50 MG tablet Take 1 tablet by mouth daily.     vitamin B-12 (CYANOCOBALAMIN) 1000 MCG tablet Take 1,000 mcg by mouth daily.     No current facility-administered medications for this encounter.    Physical Findings: The patient is in no acute distress. Patient is alert and oriented.  vitals were not taken for this visit. .  No significant changes. Lungs are clear to auscultation bilaterally. Heart has regular rate and rhythm. No palpable cervical, supraclavicular, or axillary adenopathy. Abdomen soft, non-tender, normal bowel sounds.  On pelvic examination the external genitalia were unremarkable. A speculum exam was performed. There are no mucosal lesions noted in the vaginal vault. A Pap smear was obtained of the proximal vagina. On bimanual and rectovaginal examination there were no pelvic masses appreciated. ***   Lab Findings: Lab Results  Component Value Date   WBC 7.5 01/05/2023   HGB 10.0 (L) 01/08/2023   HCT 31.9 (L) 01/08/2023   MCV 78.8 (L) 01/05/2023   PLT 222 01/05/2023    Radiographic Findings: No results found.  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  The patient is recovering from the effects of radiation.  ***  Plan:  Per NCCN guidelines, patient will follow up with Dr. Pricilla Holm in 3 months and radiation oncology in 6 months. Patient was educated on signs/symptoms that may indicate disease recurrence and understands to notify us if she experiences any of them.    *** minutes of total time was spent for this patient encounter, including preparation, face-to-face counseling with the patient and coordination of care, physical exam, and documentation of the encounter. ____________________________________   Bryan Lemma, PA-C   Billie Lade,  PhD, MD   [facility]  Radiation Oncology Direct Dial: [phone]  Fax: [phone] [email or web address]    This document serves as a record of services personally performed by Antony Blackbird, MD. It was created on his behalf by Neena Rhymes, a trained medical scribe. The creation of this record is based on the scribe's personal observations and the provider's statements to them. This document has been checked and approved by the attending provider.

## 2023-12-03 ENCOUNTER — Telehealth: Payer: Self-pay

## 2023-12-03 ENCOUNTER — Ambulatory Visit
Admission: RE | Admit: 2023-12-03 | Discharge: 2023-12-03 | Disposition: A | Discharge status: Home / Self Care | Source: Ambulatory Visit | Attending: Radiation Oncology | Admitting: Radiation Oncology

## 2023-12-03 VITALS — BP 155/99 | HR 73 | Temp 96.9°F | Resp 18 | Ht 64.0 in | Wt 208.5 lb

## 2023-12-03 DIAGNOSIS — Z923 Personal history of irradiation: Secondary | ICD-10-CM | POA: Diagnosis not present

## 2023-12-03 DIAGNOSIS — C541 Malignant neoplasm of endometrium: Secondary | ICD-10-CM | POA: Diagnosis present

## 2023-12-03 DIAGNOSIS — Z79899 Other long term (current) drug therapy: Secondary | ICD-10-CM | POA: Insufficient documentation

## 2023-12-03 DIAGNOSIS — R3915 Urgency of urination: Secondary | ICD-10-CM | POA: Diagnosis not present

## 2023-12-03 NOTE — Progress Notes (Signed)
 Tara Adams is here today for follow up post radiation to the pelvic.  They completed their radiation on: 03/19/23   Does the patient complain of any of the following:  Pain: She reports some bone pain. Abdominal bloating: No Diarrhea/Constipation: No Nausea/Vomiting: No Vaginal Discharge:  No Blood in Urine or Stool:  no Urinary Issues (dysuria/incomplete emptying/ incontinence/ increased frequency/urgency): She reports some urgency. Does patient report using vaginal dilator 2-3 times a week and/or sexually active 2-3 weeks:  No Post radiation skin changes: No   Additional comments if applicable:

## 2023-12-03 NOTE — Telephone Encounter (Signed)
 Talbert Forest from (RAD ONC) called office.  Pt is scheduled for a follow up on 6/20  at 3:00 with Dr.Tucker.  Talbert Forest to notify pt of appointment date and time.

## 2023-12-04 ENCOUNTER — Telehealth: Payer: Self-pay | Admitting: *Deleted

## 2023-12-04 NOTE — Telephone Encounter (Signed)
 Called patient to inform that Dr. Winferd Humphrey appt. has been moved to 03-07-24- arrival time 2:45 pm, lvm for a return call

## 2023-12-04 NOTE — Telephone Encounter (Signed)
 RETURNED PATIENT'S PHONE CALL, LVM FOR A RETURN CALL

## 2024-02-01 ENCOUNTER — Ambulatory Visit: Payer: Medicare HMO | Admitting: Gynecologic Oncology

## 2024-03-05 ENCOUNTER — Encounter: Payer: Self-pay | Admitting: Gynecologic Oncology

## 2024-03-05 ENCOUNTER — Telehealth: Payer: Self-pay

## 2024-03-05 NOTE — Telephone Encounter (Signed)
 Ms.Acri states she needs to reschedule her upcoming appointment with Dr.Tucker on 6/20.   First  available with Dr.Tucker is in August, Appointment rescheduled to 7/2 with Dr.Jackson-Moore. Pt agreed to date/time with understanding she can pick back up with Dr.Tucker at next visit.

## 2024-03-07 ENCOUNTER — Ambulatory Visit: Admitting: Gynecologic Oncology

## 2024-03-18 HISTORY — PX: REFRACTIVE SURGERY: SHX103

## 2024-03-19 ENCOUNTER — Inpatient Hospital Stay: Admitting: Obstetrics & Gynecology

## 2024-03-19 ENCOUNTER — Telehealth: Payer: Self-pay

## 2024-03-19 DIAGNOSIS — C541 Malignant neoplasm of endometrium: Secondary | ICD-10-CM

## 2024-03-19 NOTE — Telephone Encounter (Signed)
 Moved patient appointment from 7/2 to 7/8  due to medical emergency.. patient confirmed

## 2024-03-25 ENCOUNTER — Telehealth: Payer: Self-pay

## 2024-03-25 ENCOUNTER — Inpatient Hospital Stay: Admitting: Psychiatry

## 2024-03-25 DIAGNOSIS — C541 Malignant neoplasm of endometrium: Secondary | ICD-10-CM

## 2024-03-25 NOTE — Telephone Encounter (Signed)
 Spoke with patient and she was supposed to be a DR Viktoria patient moved to her schedule from today to 8/28 at 3:45pm..

## 2024-03-26 NOTE — Progress Notes (Signed)
 This encounter was created in error - please disregard.

## 2024-05-08 ENCOUNTER — Telehealth: Payer: Self-pay | Admitting: *Deleted

## 2024-05-08 NOTE — Telephone Encounter (Signed)
 Moved appt on 8/28 from 3:45 pm to 2:45 pm. Patient aware

## 2024-05-09 ENCOUNTER — Encounter: Payer: Self-pay | Admitting: Gynecologic Oncology

## 2024-05-15 ENCOUNTER — Encounter: Payer: Self-pay | Admitting: Gynecologic Oncology

## 2024-05-15 ENCOUNTER — Inpatient Hospital Stay: Attending: Gynecologic Oncology | Admitting: Gynecologic Oncology

## 2024-05-15 VITALS — BP 148/76 | HR 66 | Temp 98.0°F | Resp 19 | Wt 217.2 lb

## 2024-05-15 DIAGNOSIS — C541 Malignant neoplasm of endometrium: Secondary | ICD-10-CM

## 2024-05-15 DIAGNOSIS — Z90722 Acquired absence of ovaries, bilateral: Secondary | ICD-10-CM | POA: Insufficient documentation

## 2024-05-15 DIAGNOSIS — Z90710 Acquired absence of both cervix and uterus: Secondary | ICD-10-CM | POA: Diagnosis not present

## 2024-05-15 DIAGNOSIS — Z8542 Personal history of malignant neoplasm of other parts of uterus: Secondary | ICD-10-CM | POA: Diagnosis present

## 2024-05-15 DIAGNOSIS — Z08 Encounter for follow-up examination after completed treatment for malignant neoplasm: Secondary | ICD-10-CM

## 2024-05-15 DIAGNOSIS — Z923 Personal history of irradiation: Secondary | ICD-10-CM | POA: Insufficient documentation

## 2024-05-15 DIAGNOSIS — Z9079 Acquired absence of other genital organ(s): Secondary | ICD-10-CM | POA: Insufficient documentation

## 2024-05-15 NOTE — Patient Instructions (Signed)
 It was good to see you today.  I do not see or feel any evidence of cancer recurrence on your exam.  I will see you for follow-up in 6 months.  As always, if you develop any new and concerning symptoms before your next visit, please call to see me sooner.

## 2024-05-15 NOTE — Progress Notes (Signed)
 Gynecologic Oncology Return Clinic Visit  05/15/24  Reason for Visit: surveillance  Treatment History: Oncology History Overview Note  The patient presented on 10/23/22 with PMB. First episode occurred in December with second episode in January. Both episodes with light bleeding, first x3 days and second for 1 day.    Endometrial cancer (HCC)  11/13/2022 Initial Biopsy   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 MMR IHC intact.    12/15/2022 Initial Diagnosis   Endometrial cancer (HCC)   01/04/2023 Surgery   Robotic-assisted laparoscopic total hysterectomy with bilateral salpingoophorectomy, SLN biopsy, lysis of adhesions for approximately 30 minutes, mini-lap for specimen delivery (uterus > 250gms) , cystoscopy    01/04/2023 Pathology Results   A. SENTINEL LYMPH NODE, RIGHT OBTURATOR, EXCISION:  -  1 lymph node, negative for malignancy (0/1), serially sectioned and  multiple levels examined.   B. SENTINEL LYMPH NODE, LEFT OBTURATOR, EXCISION:  -  1 lymph node, negative for malignancy (0/1), serially sectioned and  multiple levels examined.   C. UTERUS, CERVIX, BILATERAL FALLOPIAN TUBES AND OVARIES:  -  Endometrioid carcinoma with mucinous differentiation, FIGO grade 1 of  3.  - Extends 12 mm into the myometrium (myometrial thickness 20 mm) for  overall percentage of myometrial invasion of 60%, FIGO stage  IB.  -  Incidental benign leiomyomas of the myometrium  -  Unremarkable bilateral fallopian tubes and ovaries (uninvolved by  carcinoma).  - See synoptic report below for further information.    03/06/2023 - 03/19/2023 Radiation Therapy   Radiation treatment dates: 03/06/23 through 03/19/23    Site/dose:  Vagina - 30 Gy delivered in 5 Fx at 6 Gy/Fx   Technique/Mode: HDR Ir-192 / Brachytherapy      Interval History: Doing well.  Denies any vaginal bleeding.  Reports normal bowel function.  Denies any abdominal or pelvic pain.   Notes occasional dysuria, typically when she has not had enough to drink.  If she increases water  intake and/or has some cranberry juice, this improves.  Intermittent frequency at baseline.  Past Medical/Surgical History: Past Medical History:  Diagnosis Date   Cancer (HCC)    andometrial   Carotid artery occlusion    Coronary artery disease    a. ~ 2002 s/p CABG x 2 ([facility]); b. 03/2021 MV: Inferior, inferolateral, mid and apical anterior ischemia. EF 41%. High risk study.   Headache    migraines   History of radiation therapy    Endometrium- 03/06/23-03/19/23- Dr. Lynwood Nasuti   Hyperlipidemia    Hypertension    Morbid obesity [facility])    Sciatica     Past Surgical History:  Procedure Laterality Date   BREAST BIOPSY Left 11/09/2022   pathology - no cancer   BREAST BIOPSY Left 11/09/2022   US  LT BREAST BX W LOC DEV 1ST LESION IMG BX SPEC US  GUIDE 11/09/2022 [facility] MAMMOGRAPHY   CATARACT EXTRACTION Bilateral 10/2022   CESAREAN SECTION     CORONARY ARTERY BYPASS GRAFT N/A    LEFT HEART CATH AND CORS/GRAFTS ANGIOGRAPHY N/A 05/20/2021   Procedure: LEFT HEART CATH AND CORS/GRAFTS ANGIOGRAPHY;  Surgeon: Darron Deatrice LABOR, MD;  Location: [facility] INVASIVE CV LAB;  Service: Cardiovascular;  Laterality: N/A;   REFRACTIVE SURGERY Left 03/2024   ROBOTIC ASSISTED TOTAL HYSTERECTOMY WITH BILATERAL SALPINGO OOPHERECTOMY Bilateral 01/04/2023   Procedure: XI ROBOTIC ASSISTED TOTAL HYSTERECTOMY WITH BILATERAL SALPINGO OOPHORECTOMY; CYSTOSCOPY;  Surgeon: Viktoria Comer SAUNDERS, MD;  Location: [facility] OR[facility];  Service: Gynecology;  Laterality: Bilateral;  SENTINEL NODE BIOPSY N/A 01/04/2023   Procedure: SENTINEL NODE BIOPSY;  Surgeon: Viktoria Comer SAUNDERS, MD;  Location: [facility] OR[facility];  Service: Gynecology;  Laterality: N/A;    Family History  Problem Relation Age of Onset   Breast cancer Maternal Aunt    Colon cancer Neg Hx    Ovarian cancer Neg Hx    Endometrial cancer Neg Hx    Pancreatic cancer Neg  Hx    Prostate cancer Neg Hx     Social History   Socioeconomic History   Marital status: Single    Spouse name: Not on file   Number of children: Not on file   Years of education: Not on file   Highest education level: Not on file  Occupational History   Occupation: works at [employer] at [employer]  Tobacco Use   Smoking status: Former    Current packs/day: 0.00    Types: Cigarettes    Quit date: 05/29/1981    Years since quitting: 42.9   Smokeless tobacco: Never  Vaping Use   Vaping status: Never Used  Substance and Sexual Activity   Alcohol  use: Never   Drug use: Never   Sexual activity: Yes  Other Topics Concern   Not on file  Social History Narrative   Lives in [place].  Volunteers, driving elderly individuals.  Does not routinely exercise.   Social Drivers of Corporate investment banker Strain: Not on file  Food Insecurity: No Food Insecurity (03/05/2024)   Hunger Vital Sign    Worried About Running Out of Food in the Last Year: Never true    Ran Out of Food in the Last Year: Never true  Transportation Needs: No Transportation Needs (03/05/2024)   PRAPARE - Administrator, Civil Service (Medical): No    Lack of Transportation (Non-Medical): No  Physical Activity: Not on file  Stress: Not on file  Social Connections: Not on file    Current Medications:  Current Outpatient Medications:    allopurinol (ZYLOPRIM) 100 MG tablet, Take 100 mg by mouth as needed. Prevention for Gout, Disp: , Rfl:    Ascorbic Acid (VITAMIN C) 1000 MG tablet, Take 1,000 mg by mouth daily., Disp: , Rfl:    atorvastatin  (LIPITOR) 80 MG tablet, Take 1 tablet (80 mg total) by mouth daily., Disp: 90 tablet, Rfl: 0   butalbital-acetaminophen -caffeine (FIORICET) 50-325-40 MG tablet, Take 1 tablet by mouth as needed for headache or migraine., Disp: , Rfl:    CALCIUM -VITAMIN D PO, Take 1 tablet by mouth daily., Disp: , Rfl:    Cholecalciferol (VITAMIN D3) 125 MCG (5000 UT) CAPS, Take  1 capsule by mouth daily., Disp: , Rfl:    ezetimibe  (ZETIA ) 10 MG tablet, Take 1 tablet (10 mg total) by mouth daily., Disp: 90 tablet, Rfl: 3   ferrous sulfate 325 (65 FE) MG EC tablet, Take 325 mg by mouth daily with breakfast., Disp: , Rfl:    gabapentin  (NEURONTIN ) 100 MG capsule, Take 100 mg by mouth as needed (pain)., Disp: , Rfl:    hydrocortisone cream 0.5 %, Apply 1 application topically as needed for itching (Eczema)., Disp: , Rfl:    isosorbide  mononitrate (IMDUR ) 60 MG 24 hr tablet, Take 1.5 tablets (90 mg total) by mouth daily., Disp: 135 tablet, Rfl: 3   Multiple Vitamin (MULTIVITAMIN WITH MINERALS) TABS tablet, Take 1 tablet by mouth daily., Disp: , Rfl:    Multiple Vitamins-Minerals (HAIR/SKIN/NAILS/BIOTIN PO), Take 2 tablets by mouth daily., Disp: , Rfl:  nitroGLYCERIN  (NITROSTAT ) 0.4 MG SL tablet, Place 0.4 mg under the tongue every 5 (five) minutes as needed for chest pain., Disp: , Rfl:    Omega-3 Fatty Acids (FISH OIL) 300 MG CAPS, Take 300 mg by mouth once. Patient takes this over the counter once daily, Disp: , Rfl:    OVER THE COUNTER MEDICATION, Take 1 tablet by mouth daily. Beets chew, Disp: , Rfl:    Polyethyl Glycol-Propyl Glycol (SYSTANE OP), Place 1 drop into both eyes in the morning, at noon, in the evening, and at bedtime., Disp: , Rfl:    potassium chloride  (KLOR-CON  M) 10 MEQ tablet, Take 1 tablet (10 mEq total) by mouth 2 (two) times daily., Disp: 4 tablet, Rfl: 0   Probiotic Product (PROBIOTIC PO), Take 2 capsules by mouth daily., Disp: , Rfl:    senna-docusate (SENOKOT-S) 8.6-50 MG tablet, Take 2 tablets by mouth at bedtime. For AFTER surgery, do not take if having diarrhea, Disp: 30 tablet, Rfl: 0   tiZANidine  (ZANAFLEX ) 4 MG tablet, Take 4 mg by mouth as needed for muscle spasms., Disp: , Rfl:    triamterene-hydrochlorothiazide (MAXZIDE) 75-50 MG tablet, Take 1 tablet by mouth daily., Disp: , Rfl:    vitamin B-12 (CYANOCOBALAMIN) 1000 MCG tablet, Take 1,000  mcg by mouth daily., Disp: , Rfl:   Review of Systems: + mouth sores Denies appetite changes, fevers, chills, fatigue, unexplained weight changes. Denies hearing loss, neck lumps or masses, ringing in ears or voice changes. Denies cough or wheezing.  Denies shortness of breath. Denies chest pain or palpitations. Denies leg swelling. Denies abdominal distention, pain, blood in stools, constipation, diarrhea, nausea, vomiting, or early satiety. Denies pain with intercourse, dysuria, frequency, hematuria or incontinence. Denies hot flashes, pelvic pain, vaginal bleeding or vaginal discharge.   Denies joint pain, back pain or muscle pain/cramps. Denies itching, rash, or wounds. Denies dizziness, headaches, numbness or seizures. Denies swollen lymph nodes or glands, denies easy bruising or bleeding. Denies anxiety, depression, confusion, or decreased concentration.  Physical Exam: BP (!) 153/69 (BP Location: Left Arm, Patient Position: Sitting)   Pulse 66   Temp 98 F (36.7 C) (Oral)   Resp 19   Wt 217 lb 3.2 oz (98.5 kg)   SpO2 99%   BMI 37.28 kg/m  General: Alert, oriented, no acute distress. HEENT: Posterior oropharynx clear, sclera anicteric. Chest: Clear to auscultation bilaterally.  No wheezes or rhonchi. Cardiovascular: Regular rate and rhythm, no murmurs. Abdomen: Obese, soft, nontender.  Normoactive bowel sounds.  No masses or hepatosplenomegaly appreciated.  Well-healed incisions. Extremities: Grossly normal range of motion.  Warm, well perfused.  No edema bilaterally. Skin: No rashes or lesions noted. Lymphatics: No cervical, supraclavicular, or inguinal adenopathy. GU: Normal appearing external genitalia without erythema, excoriation, or lesions.  Speculum exam reveals mildly atrophic vaginal mucosa with some radiation changes at the apex.  Bimanual exam reveals cuff is smooth, no nodularity or masses.  Rectovaginal exam confirms these findings.  Laboratory & Radiologic  Studies: None new  Assessment & Plan: Tara Adams is a 73 y.o. 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 MMRp, p53 WT.   The patient is doing well.  She is NED on exam.   Discussed importance of regular dilator use to prevent vaginal agglutination.   Per NCCN surveillance recommendations, we will continue with visits every 3 months alternating between my office and radiation oncology.  I discussed signs and symptoms that  would be concerning for cancer recurrence and should prompt a phone call before her next scheduled visit.    20 minutes of total time was spent for this patient encounter, including preparation, face-to-face counseling with the patient and coordination of care, and documentation of the encounter.  Comer Dollar, MD  Division of Gynecologic Oncology  Department of Obstetrics and Gynecology  [facility]

## 2024-06-09 ENCOUNTER — Ambulatory Visit: Payer: Self-pay | Admitting: Radiation Oncology

## 2024-06-30 ENCOUNTER — Ambulatory Visit: Payer: Self-pay | Admitting: Radiation Oncology

## 2024-07-01 ENCOUNTER — Other Ambulatory Visit: Payer: Self-pay | Admitting: Nurse Practitioner

## 2024-07-13 NOTE — Progress Notes (Signed)
 Radiation Oncology         [phone] ________________________________  Name: Tara Adams MRN: [id number]  Date: 07/14/2024  DOB: 02/14/1951  Follow-Up Visit Note  CC: [place] HERO, MD  Viktoria Comer SAUNDERS, MD  No diagnosis found.  Diagnosis: The encounter diagnosis was Endometrial cancer (HCC).   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  Interval Since Last Radiation: 1 year, 3 months, and 26 days   Indication for treatment: Curative       Radiation treatment dates: 03/06/23 through 03/19/23  Site/dose:  Vagina - 30 Gy delivered in 5 Fx at 6 Gy/Fx Technique/Mode: HDR Ir-192 / Brachytherapy    Narrative:  The patient returns today for routine follow-up. She was last seen here for follow-up on 12/03/23.      In the interval since her last visit, she was seen by Dr. Viktoria on 05/15/24 for routine follow-up visit. She was noted to be doing well at that time and NED on examination. (She did endorse some mild dysuria at that time which improves with adequate hydration).   No other significant interval history since the patient was last seen for follow-up.   ***                           Allergies:  is allergic to codeine.  Meds: Current Outpatient Medications  Medication Sig Dispense Refill   allopurinol (ZYLOPRIM) 100 MG tablet Take 100 mg by mouth as needed. Prevention for Gout     Ascorbic Acid (VITAMIN C) 1000 MG tablet Take 1,000 mg by mouth daily.     atorvastatin  (LIPITOR) 80 MG tablet Take 1 tablet (80 mg total) by mouth daily. 90 tablet 0   butalbital-acetaminophen -caffeine (FIORICET) 50-325-40 MG tablet Take 1 tablet by mouth as needed for headache or migraine.     CALCIUM -VITAMIN D PO Take 1 tablet by mouth daily.     Cholecalciferol (VITAMIN D3) 125 MCG (5000 UT) CAPS Take 1 capsule by mouth daily.     ezetimibe  (ZETIA ) 10 MG tablet Take 1  tablet by mouth once daily 30 tablet 0   ferrous sulfate 325 (65 FE) MG EC tablet Take 325 mg by mouth daily with breakfast.     gabapentin  (NEURONTIN ) 100 MG capsule Take 100 mg by mouth as needed (pain).     hydrocortisone cream 0.5 % Apply 1 application topically as needed for itching (Eczema).     isosorbide  mononitrate (IMDUR ) 60 MG 24 hr tablet Take 1.5 tablets (90 mg total) by mouth daily. 135 tablet 3   Multiple Vitamin (MULTIVITAMIN WITH MINERALS) TABS tablet Take 1 tablet by mouth daily.     Multiple Vitamins-Minerals (HAIR/SKIN/NAILS/BIOTIN PO) Take 2 tablets by mouth daily.     nitroGLYCERIN  (NITROSTAT ) 0.4 MG SL tablet Place 0.4 mg under the tongue every 5 (five) minutes as needed for chest pain.     Omega-3 Fatty Acids (FISH OIL) 300 MG CAPS Take 300 mg by mouth once. Patient takes this over the counter once daily     OVER THE COUNTER MEDICATION Take 1 tablet by mouth daily. Beets chew     Polyethyl Glycol-Propyl Glycol (SYSTANE OP) Place 1 drop into both eyes in the morning, at noon, in the evening, and at bedtime.     potassium chloride  (KLOR-CON  M) 10 MEQ tablet Take 1 tablet (10 mEq total) by mouth 2 (two)  times daily. 4 tablet 0   Probiotic Product (PROBIOTIC PO) Take 2 capsules by mouth daily.     senna-docusate (SENOKOT-S) 8.6-50 MG tablet Take 2 tablets by mouth at bedtime. For AFTER surgery, do not take if having diarrhea 30 tablet 0   tiZANidine  (ZANAFLEX ) 4 MG tablet Take 4 mg by mouth as needed for muscle spasms.     triamterene-hydrochlorothiazide (MAXZIDE) 75-50 MG tablet Take 1 tablet by mouth daily.     vitamin B-12 (CYANOCOBALAMIN) 1000 MCG tablet Take 1,000 mcg by mouth daily.     No current facility-administered medications for this encounter.    Physical Findings: The patient is in no acute distress. Patient is alert and oriented.  vitals were not taken for this visit. .  No significant changes. Lungs are clear to auscultation bilaterally. Heart has regular  rate and rhythm. No palpable cervical, supraclavicular, or axillary adenopathy. Abdomen soft, non-tender, normal bowel sounds.  On pelvic examination the external genitalia were unremarkable. A speculum exam was performed. There are no mucosal lesions noted in the vaginal vault. A Pap smear was obtained of the proximal vagina. On bimanual and rectovaginal examination there were no pelvic masses appreciated. ***   Lab Findings: Lab Results  Component Value Date   WBC 7.5 01/05/2023   HGB 10.0 (L) 01/08/2023   HCT 31.9 (L) 01/08/2023   MCV 78.8 (L) 01/05/2023   PLT 222 01/05/2023    Radiographic Findings: No results found.  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    The patient is recovering from the effects of radiation.  ***  Plan:  ***   *** minutes of total time was spent for this patient encounter, including preparation, face-to-face counseling with the patient and coordination of care, physical exam, and documentation of the encounter. ____________________________________  Lynwood CHARM Nasuti, PhD, MD  This document serves as a record of services personally performed by Lynwood Nasuti, MD. It was created on his behalf by Dorthy Fuse, a trained medical scribe. The creation of this record is based on the scribe's personal observations and the provider's statements to them. This document has been checked and approved by the attending provider.

## 2024-07-14 ENCOUNTER — Telehealth: Payer: Self-pay | Admitting: Radiation Oncology

## 2024-07-14 ENCOUNTER — Encounter: Payer: Self-pay | Admitting: Radiation Oncology

## 2024-07-14 ENCOUNTER — Ambulatory Visit
Admission: RE | Admit: 2024-07-14 | Discharge: 2024-07-14 | Disposition: A | Payer: Self-pay | Source: Ambulatory Visit | Attending: Radiation Oncology | Admitting: Radiation Oncology

## 2024-07-14 DIAGNOSIS — Z90710 Acquired absence of both cervix and uterus: Secondary | ICD-10-CM | POA: Insufficient documentation

## 2024-07-14 DIAGNOSIS — Z79899 Other long term (current) drug therapy: Secondary | ICD-10-CM | POA: Diagnosis not present

## 2024-07-14 DIAGNOSIS — Z8542 Personal history of malignant neoplasm of other parts of uterus: Secondary | ICD-10-CM | POA: Insufficient documentation

## 2024-07-14 DIAGNOSIS — Z923 Personal history of irradiation: Secondary | ICD-10-CM | POA: Diagnosis not present

## 2024-07-14 DIAGNOSIS — Z90722 Acquired absence of ovaries, bilateral: Secondary | ICD-10-CM | POA: Insufficient documentation

## 2024-07-14 DIAGNOSIS — B370 Candidal stomatitis: Secondary | ICD-10-CM | POA: Insufficient documentation

## 2024-07-14 DIAGNOSIS — C541 Malignant neoplasm of endometrium: Secondary | ICD-10-CM

## 2024-07-14 MED ORDER — FLUCONAZOLE 100 MG PO TABS
100.0000 mg | ORAL_TABLET | Freq: Every day | ORAL | 0 refills | Status: AC
Start: 2024-07-14 — End: ?

## 2024-07-14 NOTE — Progress Notes (Signed)
 Tara Adams is here today for follow up post radiation to the pelvic.  They completed their radiation on: 03/19/2023  Does the patient complain of any of the following:  Pain: Denies  Abdominal bloating: Denies Diarrhea/Constipation: Denies Nausea/Vomiting: Denies Vaginal Discharge: Denies Blood in Urine or Stool: Denies Urinary Issues (dysuria/incomplete emptying/ incontinence/ increased frequency/urgency): Denies Does patient report using vaginal dilator 2-3 times a week and/or sexually active 2-3 weeks: Yes, she reports using dilators twice a week. Post radiation skin changes: Denies   Additional comments if applicable: None at this time.  BP (!) (P) 141/88 (BP Location: Left Arm, Patient Position: Sitting)   Pulse (P) 63   Temp (!) (P) 97.1 F (36.2 C) (Temporal)   Resp (P) 18   Ht (P) 5' 4 (1.626 m)   Wt (P) 211 lb 8 oz (95.9 kg)   BMI (P) 36.30 kg/m

## 2024-10-31 ENCOUNTER — Inpatient Hospital Stay: Admitting: Gynecologic Oncology

## 2024-12-22 ENCOUNTER — Ambulatory Visit: Admitting: Radiation Oncology
